# Patient Record
Sex: Female | Born: 1945 | Race: Black or African American | Hispanic: No | Marital: Single | Smoking: Never smoker
Health system: Southern US, Community
[De-identification: ages and names within clinical notes are randomized; demographics above are authoritative.]

## PROBLEM LIST (undated history)

## (undated) DIAGNOSIS — D649 Anemia, unspecified: Secondary | ICD-10-CM

## (undated) DIAGNOSIS — E559 Vitamin D deficiency, unspecified: Secondary | ICD-10-CM

## (undated) HISTORY — PX: TUBAL LIGATION: SHX77

## (undated) HISTORY — DX: Vitamin D deficiency, unspecified: E55.9

## (undated) HISTORY — PX: OTHER SURGICAL HISTORY: SHX169

## (undated) HISTORY — DX: Anemia, unspecified: D64.9

---

## 1997-09-28 ENCOUNTER — Ambulatory Visit (HOSPITAL_COMMUNITY): Admission: RE | Admit: 1997-09-28 | Discharge: 1997-09-28 | Payer: Self-pay | Admitting: Gastroenterology

## 1997-11-15 ENCOUNTER — Other Ambulatory Visit: Admission: RE | Admit: 1997-11-15 | Discharge: 1997-11-15 | Payer: Self-pay | Admitting: Obstetrics and Gynecology

## 1998-06-23 ENCOUNTER — Other Ambulatory Visit: Admission: RE | Admit: 1998-06-23 | Discharge: 1998-06-23 | Payer: Self-pay | Admitting: Obstetrics and Gynecology

## 1998-09-05 ENCOUNTER — Encounter: Payer: Self-pay | Admitting: Family Medicine

## 1998-09-05 ENCOUNTER — Ambulatory Visit (HOSPITAL_COMMUNITY): Admission: RE | Admit: 1998-09-05 | Discharge: 1998-09-05 | Payer: Self-pay | Admitting: Family Medicine

## 1998-09-22 ENCOUNTER — Encounter: Payer: Self-pay | Admitting: Gastroenterology

## 1998-09-22 ENCOUNTER — Ambulatory Visit (HOSPITAL_COMMUNITY): Admission: RE | Admit: 1998-09-22 | Discharge: 1998-09-22 | Payer: Self-pay | Admitting: Gastroenterology

## 1998-11-01 ENCOUNTER — Ambulatory Visit (HOSPITAL_COMMUNITY): Admission: RE | Admit: 1998-11-01 | Discharge: 1998-11-01 | Payer: Self-pay | Admitting: Gastroenterology

## 1998-11-01 ENCOUNTER — Encounter: Payer: Self-pay | Admitting: Gastroenterology

## 1998-11-10 ENCOUNTER — Encounter: Payer: Self-pay | Admitting: Gastroenterology

## 1998-11-10 ENCOUNTER — Ambulatory Visit (HOSPITAL_COMMUNITY): Admission: RE | Admit: 1998-11-10 | Discharge: 1998-11-10 | Payer: Self-pay | Admitting: Gastroenterology

## 1998-12-21 ENCOUNTER — Encounter: Admission: RE | Admit: 1998-12-21 | Discharge: 1998-12-21 | Payer: Self-pay | Admitting: Family Medicine

## 1999-01-02 ENCOUNTER — Other Ambulatory Visit: Admission: RE | Admit: 1999-01-02 | Discharge: 1999-01-02 | Payer: Self-pay | Admitting: Obstetrics and Gynecology

## 1999-11-13 ENCOUNTER — Encounter (INDEPENDENT_AMBULATORY_CARE_PROVIDER_SITE_OTHER): Payer: Self-pay | Admitting: *Deleted

## 1999-11-13 ENCOUNTER — Ambulatory Visit (HOSPITAL_COMMUNITY): Admission: RE | Admit: 1999-11-13 | Discharge: 1999-11-13 | Payer: Self-pay | Admitting: Gastroenterology

## 1999-11-20 ENCOUNTER — Encounter: Admission: RE | Admit: 1999-11-20 | Discharge: 1999-11-20 | Payer: Self-pay | Admitting: Gastroenterology

## 1999-11-20 ENCOUNTER — Encounter: Payer: Self-pay | Admitting: Gastroenterology

## 1999-11-30 ENCOUNTER — Encounter: Payer: Self-pay | Admitting: General Surgery

## 1999-12-03 ENCOUNTER — Inpatient Hospital Stay (HOSPITAL_COMMUNITY): Admission: RE | Admit: 1999-12-03 | Discharge: 1999-12-10 | Payer: Self-pay | Admitting: General Surgery

## 1999-12-03 ENCOUNTER — Encounter (INDEPENDENT_AMBULATORY_CARE_PROVIDER_SITE_OTHER): Payer: Self-pay | Admitting: Specialist

## 2000-03-20 ENCOUNTER — Other Ambulatory Visit: Admission: RE | Admit: 2000-03-20 | Discharge: 2000-03-20 | Payer: Self-pay | Admitting: Obstetrics and Gynecology

## 2000-06-27 ENCOUNTER — Inpatient Hospital Stay (HOSPITAL_COMMUNITY): Admission: RE | Admit: 2000-06-27 | Discharge: 2000-06-29 | Payer: Self-pay | Admitting: General Surgery

## 2000-09-03 ENCOUNTER — Encounter: Admission: RE | Admit: 2000-09-03 | Discharge: 2000-09-03 | Payer: Self-pay | Admitting: Obstetrics and Gynecology

## 2000-09-03 ENCOUNTER — Encounter: Payer: Self-pay | Admitting: Obstetrics and Gynecology

## 2001-02-03 ENCOUNTER — Inpatient Hospital Stay (HOSPITAL_COMMUNITY): Admission: AD | Admit: 2001-02-03 | Discharge: 2001-02-04 | Payer: Self-pay | Admitting: Family Medicine

## 2001-03-27 ENCOUNTER — Other Ambulatory Visit: Admission: RE | Admit: 2001-03-27 | Discharge: 2001-03-27 | Payer: Self-pay | Admitting: Obstetrics and Gynecology

## 2001-04-17 ENCOUNTER — Encounter (HOSPITAL_COMMUNITY): Admission: RE | Admit: 2001-04-17 | Discharge: 2001-07-16 | Payer: Self-pay | Admitting: Gastroenterology

## 2001-05-20 ENCOUNTER — Encounter (INDEPENDENT_AMBULATORY_CARE_PROVIDER_SITE_OTHER): Payer: Self-pay | Admitting: Specialist

## 2001-05-20 ENCOUNTER — Ambulatory Visit (HOSPITAL_COMMUNITY): Admission: RE | Admit: 2001-05-20 | Discharge: 2001-05-20 | Payer: Self-pay | Admitting: Gastroenterology

## 2001-05-25 ENCOUNTER — Encounter: Payer: Self-pay | Admitting: Gastroenterology

## 2001-05-25 ENCOUNTER — Encounter: Admission: RE | Admit: 2001-05-25 | Discharge: 2001-05-25 | Payer: Self-pay | Admitting: Gastroenterology

## 2001-09-04 ENCOUNTER — Encounter: Payer: Self-pay | Admitting: Family Medicine

## 2001-09-04 ENCOUNTER — Encounter: Admission: RE | Admit: 2001-09-04 | Discharge: 2001-09-04 | Payer: Self-pay | Admitting: Family Medicine

## 2002-04-21 ENCOUNTER — Encounter: Payer: Self-pay | Admitting: Family Medicine

## 2002-04-21 ENCOUNTER — Encounter: Admission: RE | Admit: 2002-04-21 | Discharge: 2002-04-21 | Payer: Self-pay | Admitting: Family Medicine

## 2002-08-12 ENCOUNTER — Encounter: Admission: RE | Admit: 2002-08-12 | Discharge: 2002-08-12 | Payer: Self-pay | Admitting: Family Medicine

## 2002-08-12 ENCOUNTER — Encounter: Payer: Self-pay | Admitting: Family Medicine

## 2002-09-07 ENCOUNTER — Encounter: Admission: RE | Admit: 2002-09-07 | Discharge: 2002-09-07 | Payer: Self-pay | Admitting: Obstetrics and Gynecology

## 2002-09-07 ENCOUNTER — Encounter: Payer: Self-pay | Admitting: Obstetrics and Gynecology

## 2003-09-16 ENCOUNTER — Encounter: Admission: RE | Admit: 2003-09-16 | Discharge: 2003-09-16 | Payer: Self-pay | Admitting: Family Medicine

## 2004-09-26 ENCOUNTER — Encounter: Admission: RE | Admit: 2004-09-26 | Discharge: 2004-09-26 | Payer: Self-pay | Admitting: Family Medicine

## 2005-09-27 ENCOUNTER — Encounter: Admission: RE | Admit: 2005-09-27 | Discharge: 2005-09-27 | Payer: Self-pay | Admitting: Family Medicine

## 2006-06-24 ENCOUNTER — Ambulatory Visit: Payer: Self-pay | Admitting: *Deleted

## 2006-08-26 ENCOUNTER — Ambulatory Visit: Payer: Self-pay | Admitting: Vascular Surgery

## 2006-09-29 ENCOUNTER — Encounter: Admission: RE | Admit: 2006-09-29 | Discharge: 2006-09-29 | Payer: Self-pay | Admitting: Family Medicine

## 2007-05-07 ENCOUNTER — Encounter: Admission: RE | Admit: 2007-05-07 | Discharge: 2007-05-07 | Payer: Self-pay | Admitting: *Deleted

## 2007-09-30 ENCOUNTER — Encounter: Admission: RE | Admit: 2007-09-30 | Discharge: 2007-09-30 | Payer: Self-pay | Admitting: Family Medicine

## 2007-10-04 ENCOUNTER — Emergency Department (HOSPITAL_COMMUNITY): Admission: EM | Admit: 2007-10-04 | Discharge: 2007-10-04 | Payer: Self-pay | Admitting: Emergency Medicine

## 2008-09-30 ENCOUNTER — Encounter: Admission: RE | Admit: 2008-09-30 | Discharge: 2008-09-30 | Payer: Self-pay | Admitting: Family Medicine

## 2009-04-14 ENCOUNTER — Ambulatory Visit: Payer: Self-pay | Admitting: Vascular Surgery

## 2009-10-04 ENCOUNTER — Encounter: Admission: RE | Admit: 2009-10-04 | Discharge: 2009-10-04 | Payer: Self-pay | Admitting: Family Medicine

## 2010-07-27 NOTE — Procedures (Signed)
[place]. [facility]  Patient:    Maria Hampton, Maria Hampton Visit Number: [id number] MRN: [id number]          Service Type: END Location: ENDO Attending Physician:  Charna Elizabeth Dictated by:   Anselmo Rod, M.D. Proc. Date: 05/20/01 Admit Date:  05/20/2001   CC:         Geraldo Pitter, M.D.  Adolph Pollack, M.D.   Procedure Report  DATE OF BIRTH:  November 26, 1945  REFERRING PHYSICIAN:  Geraldo Pitter, M.D.  PROCEDURE PERFORMED:  Esophagogastroduodenoscopy.  ENDOSCOPIST:  Anselmo Rod, M.D.  INSTRUMENT USED:  Olympus video panendoscope.  INDICATIONS FOR PROCEDURE:  Iron deficiency anemia in a 65 year old African-American female.  Rule out peptic ulcer disease, esophagitis, gastritis, etc.  PREPROCEDURE PREPARATION:  Informed consent was procured from the patient. The patient was fasted for eight hours prior to the procedure.  PREPROCEDURE PHYSICAL:  The patient had stable vital signs.  Neck supple. Chest clear to auscultation.  S1, S2 regular.  Abdomen obese with a well-healed surgical scar.  Nontender with normal bowel sounds.  DESCRIPTION OF PROCEDURE:  The patient was placed in left lateral decubitus position and sedated with 70 mg of Demerol and 6 mg of Versed intravenously. Once the patient was adequately sedated and maintained on low-flow oxygen and continuous cardiac monitoring, the Olympus video panendoscope was advanced through the mouthpiece, over the tongue, into the esophagus under direct vision.  The entire esophagus, stomach, and proximal small bowel appeared normal and without lesions.  IMPRESSION:  Normal esophagogastroduodenoscopy.  RECOMMENDATION:  Proceed with colonoscopy at this time. Dictated by:   Anselmo Rod, M.D. Attending Physician:  Charna Elizabeth DD:  05/20/01 TD:  05/21/01 Job: [id number]

## 2010-07-27 NOTE — H&P (Signed)
[facility]  Patient:    Maria Hampton, Maria Hampton                 MRN: [id number] Adm. Date:  [id number] Disc. Date: [id number] Attending:  Charna Elizabeth                         History and Physical  REASON FOR ADMISSION:  Elective intestinal resection.  HISTORY OF PRESENT ILLNESS:  Maria Hampton is a 65 year old female with a long history of Crohns disease.  She initially had a partial small bowel resection and partial ascending colon resection back in 1985 by Dr. Milus Mallick. By report, 18 inches of intestine were removed at this time.  Approximately one year prior to admission, she began having some crampy abdominal pain, was treated medically and this improved.  She had been having similar problems; however, medical therapy has failed her.  Dr. Anselmo Rod ordered an upper GI and small-bowel follow-through, which shows approximately 30- to 40-cm area of narrowing in the terminal ileum area; there are also areas suggesting fistulous tracts in that region.  A colonoscopy was performed which demonstrated some active inflammation in the proximal transverse colon.  She is admitted for elective resection because she has partial intestinal obstruction symptoms and this diseased segment is not responding to medical management.  PAST MEDICAL HISTORY:  Crohns disease.  PREVIOUS OPERATIONS:  Laparotomy, resection of distal ileum and part of ascending colon in 1985.  ALLERGIES:  SULFA MEDICATIONS, ASACOL, IRON, CLIMARA, MEDROXYPROGESTERONE, CELEBREX, DICYCLOMINE, FOLIC ACID, VITAMIN B12 INJECTIONS, ZYRTEC.  SOCIAL HISTORY:  She is divorced.  She denies use of tobacco or alcohol.  She works for [employer].  REVIEW OF SYSTEMS:  CARDIOVASCULAR:  No significant hypertension or coronary artery disease.  PULMONARY:  No acute or chronic lung problems.  HEMATOLOGIC: No previous deep venous thrombosis or bleeding disorders.  PHYSICAL  EXAMINATION  GENERAL:  A well-developed, well-nourished female in no acute distress, very pleasant and cooperative.  EYES:  Extraocular muscles intact.  Sclerae clear.  NECK:  Supple without palpable masses.  NODES:  No palpable cervical or supraclavicular adenopathy.  CARDIOVASCULAR:  Heart demonstrates a regular rate and rhythm without a murmur.  No lower extremity edema.  RESPIRATORY:  Breath sounds equal and clear.  Respirations nonlabored.  ABDOMEN:  Soft, nontender.  There is a well-healed midline scar.  No evidence of ______ scar.  No palpable masses are noted.  No organomegaly is noted.  MUSCULOSKELETAL:  Full range of motion.  No cyanosis or edema.  IMPRESSION:  Crohns disease with evidence of partial intestinal obstruction and a long stricture.  She has progressive symptoms despite medical therapy.  PLAN:  Exploratory laparotomy and bowel resection.  The procedure and risks include, but are not limited to, bleeding, infection, anastomotic problems, possibility of malabsorption syndrome and the risks of anesthesia were explained to her.  She seemed to understand and agreed to proceed. DD:  12/03/99 TD:  12/03/99 Job: [id number]

## 2010-07-27 NOTE — Op Note (Signed)
[facility]  Patient:    Maria Hampton, Maria Hampton Visit Number: [id number] MRN: [id number]          Service Type: MED Location: [phone] Attending Physician:  Beverely Low Proc. Date: 12/03/99 Admit Date:  02/03/2001 Discharge Date: 02/04/2001                             Operative Report  PREOPERATIVE DIAGNOSES:  Partial small bowel obstruction secondary to Crohns disease.  POSTOPERATIVE DIAGNOSES:  Partial small bowel obstruction secondary to Crohns disease.  PROCEDURE:  Exploratory laparotomy, partial colectomy with resection of 10 cm of distal ileum.  SURGEON:  Dr. Abbey Chatters.  ASSISTANT:  Dr. Francina Ames.  ANESTHESIA:  General.  INDICATIONS FOR PROCEDURE:  Maria Hampton is a 65 year old female with known Crohns disease who underwent a right colectomy and distal ileum resection approximately 15-16 years ago for complications from Crohns disease. Recently, she has been having progressively increasing crampy abdominal pain and occult blood loss with diarrhea. She is anemic with a hemoglobin of 8.5. She underwent a small bowel follow through which demonstrated a stricture which would be near the site of the anastomosis. She also underwent endoscopy which demonstrated disease segments of what appeared to be transverse colon. She is admitted today for exploratory operation.  FINDINGS:  At the area of anastomosis between the ileum and what appeared to be the hepatic flexure of the right colon, there was a firm mass that was partially obstructed. The proximal 1/3 to 1/2 of the transverse colon was involved with the diseased segment. Except for a small portion of the distal ileum, the rest of the small intestinal appeared normal.  TECHNIQUE:  She was placed supine on the operating table and a general anesthetic was administered. Her abdomen was sterilely prepped and draped. A previous midline incision was reincised all the way down to the  level of the fascia. Old sutures were removed. The fascia was incised and the underlying peritoneum was then incised sharply entering the peritoneal cavity. There were some adhesions from the omentum to the anterior abdominal wall superiorly on the incision but no significant intestinal adhesions. Once inside the abdomen, it was explored.  I ______ the small intestine, ran it for its length, and it appeared to be normal except at that point where there was anastomosis between the small intestine and the colon. This appeared to be in the area of the proximal ascending colon/hepatic flexure region. There was a firm inflammatory mass partially obstructing. The proximal 1/3 to 1/2 of the transverse colon was also diseased and thickened.  I subsequently mobilized the previous anastomotic site away from the kidney and the duodenum using cautery and sharp dissection. I divided the omentum at the point where the transverse colon became normal. I then divided the distal ileum resecting approximately 10 cm of it and then divided the transverse colon. I ligated the mesenteric vessels and handed the specimen off the field. Next, a side to side stapled anastomosis was performed between the mid transverse colon and the ileum. The anastomosis was patent, viable and under no tension. The mesenteric defect was closed with interrupted Vicryl sutures.  I subsequently irrigated out the abdominal cavity and no bleeding was noted. I then placed a piece of ______ film between the viscera and the anterior abdominal wall and closed the fascia with running #1 PDS suture. The subcutaneous tissue was irrigated and skin was closed with staples.  The patient tolerated the procedure well without any apparent complications and was taken to the recovery room in satisfactory condition. Attending Physician:  Beverely Low DD:  12/03/99 TD:  12/04/99 Job: [id number]

## 2010-07-27 NOTE — Op Note (Signed)
[facility]  Patient:    Maria Hampton, Maria Hampton                   MRN: [id number] Proc. Date: 06/27/00 Adm. Date:  [id number] Disc. Date: [id number] Attending:  Arlis Porta CC:         Anselmo Rod, M.D.  Geraldo Pitter, M.D.   Operative Report  PREOPERATIVE DIAGNOSIS:  Ventral incisional hernia.  POSTOPERATIVE DIAGNOSIS:  Ventral incisional hernia.  PROCEDURE:  Laparoscopic repair of ventral incisional hernia, with Gore-Tex dual-duty radial mesh.  SURGEON:  Adolph Pollack, M.D.  ASSISTANTGunnar Fusi ______, M.D.  ANESTHESIA:  General.  INDICATIONS:  This is a 65 year old female who underwent reoperation for recurrent Crohns disease, which is complicated by wound infection. Subsequently developed an incisional hernia, which has become symptomatic, and now she presents for repair.  TECHNIQUE:  She is placed supine on the operating table and a general anesthetic was administered.  The hernia was palpated and marked.  The abdomen was sterilely prepped and draped.  A left upper quadrant lateral incision was made into the skin and subcutaneous tissue dissected.  The anterior layer of the fascia was incised.  Using blunt dissection the abdominal cavity was entered and an Hasson trocar introduced into the peritoneal cavity.  A pneumoperitoneum was created by insufflation of CO2 gas.  Next the laparoscope was introduced and I could identify the hernia.  There were also adhesions from the small intestine to the anterior abdominal wall.  Under direct vision, a 5 mm trocar was placed in the left lower quadrant and the adhesions to the anterior abdominal wall and intestine were divided sharply, dropping the intestine down into the peritoneal cavity.  An 11 mm trocar was then placed in the right mid abdomen.  I marked the periphery of the hernia with spinal needles, and then measured 3 cm away from each of these and drew an oval. This would  accommodate a 15 x 19 cm Gore-Tex dual mesh, which was brought into the field.  Anchoring sutures were placed at four quadrants, and the four quadrants were marked and corresponding marks made on the skin.  Small incisions were made at all the four quadrants.  The mesh was then inserted into the abdominal cavity, after the anchoring sutures of the four quadrants had been placed.  Once the mesh was in the abdominal cavity, it was unfurled and then through each of the small incisions the anchoring sutures were brought up through the anterior abdominal wall, then tied down and anchoring the mesh to the anterior abdominal wall.  Further anchoring was done by using the spiral tacker around the periphery.  I added one extra 5 mm trocar to the right lower quadrant in order to facilitate this.  I then placed an inner rim of tacks.  This more than adequately anchored the mesh up to the anterior abdominal wall and more then adequately covered the hernia defect.  Next, I released the pneumoperitoneum and all the trocars were removed.  The left upper quadrant fascial defect was closed with interrupted Vicryl suture. The skin incisions were closed with 4-0 Monocryl subcuticular stitches, and followed by Steri-Strips and sterile dressings.  She tolerated the procedure well without any apparent complications.  She was taken to the operating room in satisfactory condition. DD:  06/27/00 TD:  06/30/00 Job: [id number]

## 2010-07-27 NOTE — Procedures (Signed)
[place]. [facility]  Patient:    Maria Hampton, Maria Hampton Visit Number: [id number] MRN: [id number]          Service Type: END Location: ENDO Attending Physician:  Charna Elizabeth Dictated by:   Anselmo Rod, M.D. Proc. Date: 05/20/01 Admit Date:  05/20/2001   CC:         Geraldo Pitter, M.D.  Adolph Pollack, M.D.   Procedure Report  DATE OF BIRTH:  29-Dec-1945  REFERRING PHYSICIAN:  Geraldo Pitter, M.D.  PROCEDURE PERFORMED:  Colonoscopy with biopsies.  ENDOSCOPIST:  Anselmo Rod, M.D.  INSTRUMENT USED:  Olympus pediatric colonoscope.  INDICATIONS FOR PROCEDURE:  Severe iron deficiency anemia in a 65 year old African-American female, rule out recurrent Crohns.  The patient has had a distal ileal resection and a right colectomy in September of 2001 for Crohns disease causing small bowel obstruction.  PREPROCEDURE PREPARATION:  Informed consent was procured from the patient. The patient was fasted for eight hours prior to the procedure and prepped with a bottle of magnesium citrate and a gallon of NuLytely the night prior to the procedure.  PREPROCEDURE PHYSICAL:  The patient had stable vital signs.  Neck supple. Chest clear to auscultation.  S1, S2 regular.  Abdomen soft with normal abdominal bowel sounds.  A well-healed surgical scar is present.  DESCRIPTION OF PROCEDURE:  The patient was placed in the left lateral decubitus position and sedated for her EGD and no additional sedation was used for colonoscopy.  Once the patient was adequately positioned and  maintained on low-flow oxygen and continuous cardiac monitoring, the Olympus video colonoscope was advanced from the rectum to the anastomosis at about 100 cm without difficulty.  There was a patch of ulceration seen at the anastomosis which was biopsied for pathology.  Another patchy area of ulceration was seen at 20 cm.  A small patch of ulceration was noted at the anal verge.   All these areas seemed to represent recurrent Crohns.  Multiple biopsies were done.  The rest of the colonic mucosa between these areas of ulceration appeared healthy and without lesion.  IMPRESSION:  Patchy ulcerations at the anal verge at 20 cm and at the anastomosis.  The rest of the colon appeared healthy.  RECOMMENDATIONS: 1. Await pathology results.  Check CBC today along with ESR. 2. Small bowel follow-through to be scheduled ASAP. 3. Outpatient follow-up within the next week.Dictated by:   Anselmo Rod, M.D. Attending Physician:  Charna Elizabeth DD:  05/20/01 TD:  05/21/01 Job: [id number]

## 2010-07-27 NOTE — Discharge Summary (Signed)
[facility]  Patient:    Maria Hampton, Maria Hampton                   MRN: [id number] Adm. Date:  [id number] Disc. Date: [id number] Attending:  Arlis Porta CC:         Anselmo Rod, M.D.  Geraldo Pitter, M.D.   Discharge Summary  PRINCIPAL DISCHARGE DIAGNOSIS:  Partial intestinal obstruction secondary to Crohns disease.  SECONDARY DIAGNOSES: 1. Chronic anemia. 2. Mild postoperative ileus.  PROCEDURE:  Exploratory laparotomy, partial cholecystectomy with resection of distal ileum, December 03, 1999.  REASON FOR ADMISSION:  Ms. Seevers is a 65 year old female with a long history of Crohns disease who had a small bowel resection and partial right colectomy back in 1985.  She began having progressively increasing, crampy right lower quadrant pain and evaluation demonstrated a long stricture around the area of the anastomosis.  She did not respond to medical therapy and, subsequently, she was admitted for elective surgery.  HOSPITAL COURSE:  Postoperatively, she was on a PCA pump and adequate pain control.  She came in anemic and was somewhat anemic after the operation and was started on Procrit.  She did not receive a blood transfusion. She had some mild hyponatremia and a mild postoperative ileus that began to resolve.  She was started on a diet and switched over to oral analgesics.  Incisions healed well and she had, otherwise, an unremarkable postoperative course.  Her hemoglobin on December 10, 1999, was 7.1 and she was hemodynamically stable and felt to be ready for discharge.  DISPOSITION:  Discharged to home on December 10, 1999.  She is to continue all of her home medications including her Asacol and her iron.  I gave her Vicodin for pain.  She was given a sheet with activity restrictions and diet suggestions.  She will come back and see me in the office in two weeks and was told to make an appointment to see Dr. Loreta Ave in four weeks.  I  believe she is going to need some suppressive therapy for her Crohns disease. DD:  12/26/99 TD:  12/26/99 Job: [id number]

## 2010-07-27 NOTE — Procedures (Signed)
[place]. [facility]  Patient:    Maria Hampton, Maria Hampton                 MRN: [id number] Proc. Date: 11/13/99 Adm. Date:  [id number] Attending:  Charna Elizabeth CC:         Geraldo Pitter, M.D.                           Procedure Report  DATE OF BIRTH:  [birth year].  REFERRING PHYSICIAN:  Geraldo Pitter, M.D.  PROCEDURE PERFORMED:  Colonoscopy with biopsies.  ENDOSCOPIST:  Anselmo Rod, M.D.  INSTRUMENT USED:  Olympus video colonoscope.  INDICATIONS FOR PROCEDURE:  Recurrent abdominal pain with guaiac positive stools and anemia in a 65 year old black female with a history of Crohns status post resection of the terminal ileum in the past.  Colonoscopy is being done to evaluate extent of disease.  PREPROCEDURE PREPARATION:  Informed consent was procured from the patient. The patient was fasted for eight hours prior to the procedure and prepped with Fleets Phospho-Soda the night prior to the procedure.  PREPROCEDURE PHYSICAL:  The patient had stable vital signs.  Neck supple. Chest clear to auscultation.  S1, S2 regular.  No murmur, rub or gallop.  No rales, rhonchi or wheezing.  Abdomen soft with normal abdominal bowel sounds.  DESCRIPTION OF PROCEDURE:  The patient was placed in the left lateral decubitus position and sedated with 50 mg of Demerol and 5 mg of Versed intravenously.  Once the patient was adequately sedated and maintained on low-flow oxygen and continuous cardiac monitoring, the Olympus video colonoscope was advanced from the rectum to the anastomosis without difficulty.  The entire colonic mucosa up to the anastomosis appeared healthy except for a small patch of inflammation and ulceration at 30 cm.  At the anastomosis, significant stricturing was noted and the scope could not be advanced into the small bowel.  Biopsies were done from around the anastomosis.  There was significant ulceration and friability around the anastomosis.  No  other abnormalities were noted.  The rest of the colonic mucosa had normal vascular pattern with no evidence of ulceration.  The patient tolerated the procedure well without complication.  IMPRESSION:  Patchy disease at the anastomosis and at 30 cm.  Biopsies done. Results pending.  Otherwise normal-appearing colon.  RECOMMENDATIONS: 1. Await pathology results. 2. Avoid all nonsteroidals. 3. Small bowel follow-through to evaluate the anastomosis. 4. Outpatient follow-up in the next two weeks.DD:  11/13/99 TD:  11/13/99 Job: [id number]

## 2010-09-10 ENCOUNTER — Other Ambulatory Visit: Payer: Self-pay | Admitting: Family Medicine

## 2010-09-10 DIAGNOSIS — Z1231 Encounter for screening mammogram for malignant neoplasm of breast: Secondary | ICD-10-CM

## 2010-10-08 ENCOUNTER — Ambulatory Visit
Admission: RE | Admit: 2010-10-08 | Discharge: 2010-10-08 | Disposition: A | Discharge status: Home / Self Care | Payer: BC Managed Care – PPO | Source: Ambulatory Visit | Attending: Family Medicine | Admitting: Family Medicine

## 2010-10-08 DIAGNOSIS — Z1231 Encounter for screening mammogram for malignant neoplasm of breast: Secondary | ICD-10-CM

## 2011-06-14 ENCOUNTER — Other Ambulatory Visit: Payer: Self-pay | Admitting: Orthopaedic Surgery

## 2011-06-14 DIAGNOSIS — M25511 Pain in right shoulder: Secondary | ICD-10-CM

## 2011-06-21 ENCOUNTER — Ambulatory Visit
Admission: RE | Admit: 2011-06-21 | Discharge: 2011-06-21 | Disposition: A | Discharge status: Home / Self Care | Payer: BC Managed Care – PPO | Source: Ambulatory Visit | Attending: Orthopaedic Surgery | Admitting: Orthopaedic Surgery

## 2011-06-21 DIAGNOSIS — M25511 Pain in right shoulder: Secondary | ICD-10-CM

## 2011-06-21 MED ORDER — IOHEXOL 180 MG/ML  SOLN
10.0000 mL | Freq: Once | INTRAMUSCULAR | Status: AC | PRN
  Administered 2011-06-21: 10 mL via INTRA_ARTICULAR

## 2011-11-27 ENCOUNTER — Other Ambulatory Visit: Payer: Self-pay | Admitting: Family Medicine

## 2011-11-27 DIAGNOSIS — Z1231 Encounter for screening mammogram for malignant neoplasm of breast: Secondary | ICD-10-CM

## 2011-12-18 ENCOUNTER — Ambulatory Visit
Admission: RE | Admit: 2011-12-18 | Discharge: 2011-12-18 | Disposition: A | Discharge status: Home / Self Care | Payer: BC Managed Care – PPO | Source: Ambulatory Visit | Attending: Family Medicine | Admitting: Family Medicine

## 2011-12-18 DIAGNOSIS — Z1231 Encounter for screening mammogram for malignant neoplasm of breast: Secondary | ICD-10-CM

## 2013-01-06 ENCOUNTER — Other Ambulatory Visit: Payer: Self-pay

## 2013-01-06 DIAGNOSIS — Z1231 Encounter for screening mammogram for malignant neoplasm of breast: Secondary | ICD-10-CM

## 2013-02-09 ENCOUNTER — Ambulatory Visit
Admission: RE | Admit: 2013-02-09 | Discharge: 2013-02-09 | Disposition: A | Discharge status: Home / Self Care | Payer: Medicare Other | Source: Ambulatory Visit

## 2013-02-09 DIAGNOSIS — Z1231 Encounter for screening mammogram for malignant neoplasm of breast: Secondary | ICD-10-CM

## 2013-02-10 ENCOUNTER — Encounter: Payer: Self-pay | Admitting: Obstetrics & Gynecology

## 2013-02-10 ENCOUNTER — Ambulatory Visit (INDEPENDENT_AMBULATORY_CARE_PROVIDER_SITE_OTHER): Payer: 59 | Admitting: Obstetrics & Gynecology

## 2013-02-10 VITALS — BP 158/102 | HR 90 | Temp 98.4°F | Ht 66.0 in | Wt 229.4 lb

## 2013-02-10 DIAGNOSIS — B372 Candidiasis of skin and nail: Secondary | ICD-10-CM

## 2013-02-10 DIAGNOSIS — Z01419 Encounter for gynecological examination (general) (routine) without abnormal findings: Secondary | ICD-10-CM

## 2013-02-10 MED ORDER — CLOTRIMAZOLE 1 % EX CREA: 1.0000 "application " | TOPICAL_CREAM | Freq: Two times a day (BID) | CUTANEOUS | Status: DC

## 2013-02-10 NOTE — Progress Notes (Signed)
  Subjective:    Maria Hampton is a 67 y.o. female who presents for an annual exam. The patient has no complaints today. The patient is sexually active. GYN screening history: last pap: approximate date 2010 and was normal. The patient wears seatbelts: yes. The patient participates in regular exercise: no. Has the patient ever been transfused or tattooed?: yes. The patient reports that there is not domestic violence in her life.   Menstrual History: OB History   Grav Para Term Preterm Abortions TAB SAB Ect Mult Living                  No LMP recorded. Patient is postmenopausal.    The following portions of the patient's history were reviewed and updated as appropriate: allergies, current medications, past family history, past medical history, past social history, past surgical history and problem list.  Review of Systems Pertinent items are noted in HPI.    Objective:      General appearance: alert Breasts: normal appearance, no masses or tenderness Abdomen: soft, non-tender; bowel sounds normal; no masses,  no organomegaly; erythematous plaque--intertriginous skin right LE Pelvic: cervix normal in appearance, perineum with draining tracts, sinuses, no adnexal masses or tenderness, uterus normal size, shape, and consistency and vagina normal without discharge    Assessment:    Crohn's disease with perineal fistulae ?Skin changes related to the Crohn's  disease  Plan:     Topical antifungal--may need oral agent Referral-->Dermatology Return prn or 1 yr

## 2013-02-12 LAB — PAP IG AND HPV HIGH-RISK

## 2013-02-12 NOTE — Patient Instructions (Signed)

## 2013-03-18 ENCOUNTER — Encounter: Payer: Self-pay | Admitting: Obstetrics & Gynecology

## 2013-08-11 ENCOUNTER — Other Ambulatory Visit: Payer: Self-pay | Admitting: Family Medicine

## 2013-08-11 ENCOUNTER — Ambulatory Visit
Admission: RE | Admit: 2013-08-11 | Discharge: 2013-08-11 | Disposition: A | Discharge status: Home / Self Care | Payer: 59 | Source: Ambulatory Visit | Attending: Family Medicine | Admitting: Family Medicine

## 2013-08-11 DIAGNOSIS — M549 Dorsalgia, unspecified: Secondary | ICD-10-CM

## 2013-11-14 ENCOUNTER — Encounter (HOSPITAL_COMMUNITY): Payer: Self-pay | Admitting: Emergency Medicine

## 2013-11-14 ENCOUNTER — Emergency Department (HOSPITAL_COMMUNITY)
Admission: EM | Admit: 2013-11-14 | Discharge: 2013-11-14 | Disposition: A | Discharge status: Home / Self Care | Payer: Medicare Other | Attending: Emergency Medicine | Admitting: Emergency Medicine

## 2013-11-14 DIAGNOSIS — IMO0002 Reserved for concepts with insufficient information to code with codable children: Secondary | ICD-10-CM | POA: Diagnosis not present

## 2013-11-14 DIAGNOSIS — IMO0001 Reserved for inherently not codable concepts without codable children: Secondary | ICD-10-CM | POA: Insufficient documentation

## 2013-11-14 DIAGNOSIS — D649 Anemia, unspecified: Secondary | ICD-10-CM | POA: Diagnosis not present

## 2013-11-14 DIAGNOSIS — E559 Vitamin D deficiency, unspecified: Secondary | ICD-10-CM | POA: Insufficient documentation

## 2013-11-14 DIAGNOSIS — Z79899 Other long term (current) drug therapy: Secondary | ICD-10-CM | POA: Insufficient documentation

## 2013-11-14 DIAGNOSIS — M791 Myalgia, unspecified site: Secondary | ICD-10-CM

## 2013-11-14 DIAGNOSIS — R52 Pain, unspecified: Secondary | ICD-10-CM | POA: Insufficient documentation

## 2013-11-14 LAB — I-STAT CHEM 8, ED
BUN: 12 mg/dL (ref 6–23)
CHLORIDE: 108 meq/L (ref 96–112)
CREATININE: 1 mg/dL (ref 0.50–1.10)
Calcium, Ion: 1.13 mmol/L (ref 1.13–1.30)
Glucose, Bld: 86 mg/dL (ref 70–99)
HCT: 39 % (ref 36.0–46.0)
HEMOGLOBIN: 13.3 g/dL (ref 12.0–15.0)
POTASSIUM: 3.8 meq/L (ref 3.7–5.3)
Sodium: 136 mEq/L — ABNORMAL LOW (ref 137–147)
TCO2: 35 mmol/L (ref 0–100)

## 2013-11-14 LAB — CK: CK TOTAL: 60 U/L (ref 7–177)

## 2013-11-14 MED ORDER — NAPROXEN 500 MG PO TABS: 500.0000 mg | ORAL_TABLET | Freq: Two times a day (BID) | ORAL | Status: DC

## 2013-11-14 MED ORDER — METHOCARBAMOL 500 MG PO TABS: 500.0000 mg | ORAL_TABLET | Freq: Two times a day (BID) | ORAL | Status: DC | PRN

## 2013-11-14 NOTE — ED Provider Notes (Signed)
Medical screening examination/treatment/procedure(s) were performed by non-physician practitioner and as supervising physician I was immediately available for consultation/collaboration.   EKG Interpretation None        Kizzy Olafson, MD 11/14/13 1607 

## 2013-11-14 NOTE — ED Notes (Signed)
Patient states she had diarrhea a week ago and no longer has now. Patient states she has body aches, just not feeling well since then.

## 2013-11-14 NOTE — ED Notes (Signed)
Pt thinks she has a virus.  C/o fatigue, body aches, diarrhea, nausea (no vomiting), headache and cough x 1 wk.

## 2013-11-14 NOTE — ED Provider Notes (Signed)
CSN: [id number]     Arrival date & time 11/14/13  0840 History   First MD Initiated Contact with Patient 11/14/13 0913     Chief Complaint  Patient presents with  . Generalized Body Aches  . Diarrhea     (Consider location/radiation/quality/duration/timing/severity/associated sxs/prior Treatment) HPI Maria Hampton is a 68 y.o. female with a history of IBS who comes in for evaluation of generally not feeling well. She states it all started a week ago Sunday when she had generalized muscle aches and headache and a little diarrhea. She states "by Tuesday everything had resolved and I felt well until Saturday". Yesterday on Saturday she says the muscle aches return to which she localized to her hips and to a lesser extent the rest of her body. She says she just feels tired and doesn't have much energy. She has taken Tylenol without relief. No new medications, new exercise patterns, or injuries/trauma. She denies fevers at home, but does say she felt warm last night. She denies any current headache, abd pain, nausea vomiting, difficulties breathing, chest pain, new rashes, numbness or weakness.  Past Medical History  Diagnosis Date  . Vitamin D deficiency   . Anemia    Past Surgical History  Procedure Laterality Date  . Bowel reconnection      x2  . Tubal ligation     Family History  Problem Relation Age of Onset  . Hypertension Mother   . Hypertension Father   . Seizures Father    History  Substance Use Topics  . Smoking status: Never Smoker   . Smokeless tobacco: Never Used  . Alcohol Use: 1.2 oz/week    1 Glasses of wine, 1 Cans of beer per week   OB History   Grav Para Term Preterm Abortions TAB SAB Ect Mult Living                 Review of Systems  Constitutional: Negative for fever.  HENT: Negative for congestion and sore throat.   Respiratory: Negative for shortness of breath.   Cardiovascular: Negative for chest pain.  Endocrine: Negative for polyuria.   Genitourinary: Negative for dysuria.  Musculoskeletal: Positive for myalgias. Negative for back pain, neck pain and neck stiffness.  Skin: Negative for rash.  Neurological: Negative for weakness and numbness.      Allergies  Sulfa antibiotics  Home Medications   Prior to Admission medications   Medication Sig Start Date End Date Taking? Authorizing Provider  adalimumab (HUMIRA) 40 MG/0.8ML injection Inject 40 mg into the skin once a week.    Yes Historical Provider, MD  azaTHIOprine (IMURAN) 50 MG tablet Take 50 mg by mouth daily.   Yes Historical Provider, MD  BIOTIN PO Take 2 tablets by mouth daily.   Yes Historical Provider, MD  cetirizine (ZYRTEC) 10 MG tablet Take 10 mg by mouth daily.   Yes Historical Provider, MD  cholecalciferol (VITAMIN D) 1000 UNITS tablet Take 1,000 Units by mouth daily.   Yes Historical Provider, MD  ferrous sulfate 325 (65 FE) MG tablet Take 325 mg by mouth daily with breakfast.   Yes Historical Provider, MD  folic acid (FOLVITE) 1 MG tablet Take 1 mg by mouth daily.   Yes Historical Provider, MD  predniSONE (DELTASONE) 5 MG tablet Take 5 mg by mouth daily with breakfast.   Yes Historical Provider, MD  TURMERIC PO Take 1 capsule by mouth daily.   Yes Historical Provider, MD  vitamin B-12 (CYANOCOBALAMIN) 1000 MCG tablet  Take 1,000 mcg by mouth daily.   Yes Historical Provider, MD  methocarbamol (ROBAXIN) 500 MG tablet Take 1 tablet (500 mg total) by mouth 2 (two) times daily as needed for muscle spasms. 11/14/13   Earle Gell Lanora Reveron, PA-C  naproxen (NAPROSYN) 500 MG tablet Take 1 tablet (500 mg total) by mouth 2 (two) times daily. 11/14/13   Earle Gell Marypat Kimmet, PA-C   BP 131/80  Pulse 59  Temp(Src) 98.4 F (36.9 C) (Oral)  Resp 18  SpO2 100% Physical Exam  Nursing note and vitals reviewed. Constitutional: She is oriented to person, place, and time. She appears well-developed and well-nourished.  HENT:  Head: Normocephalic and atraumatic.   Mouth/Throat: Oropharynx is clear and moist.  No meningeal signs  Eyes: Conjunctivae are normal. Pupils are equal, round, and reactive to light. Right eye exhibits no discharge. Left eye exhibits no discharge. No scleral icterus.  Neck: Neck supple.  Cardiovascular: Normal rate, regular rhythm and normal heart sounds.   Pulmonary/Chest: Effort normal and breath sounds normal. No respiratory distress. She has no wheezes. She has no rales.  Abdominal: Soft. There is no tenderness.  Musculoskeletal: She exhibits no tenderness.  Neurological: She is alert and oriented to person, place, and time.  Cranial Nerves II-XII grossly intact  Skin: Skin is warm and dry. No rash noted.  Psychiatric: She has a normal mood and affect.    ED Course  Procedures (including critical care time) Labs Review Labs Reviewed  I-STAT CHEM 8, ED - Abnormal; Notable for the following:    Sodium 136 (*)    All other components within normal limits  CK    Imaging Review No results found.   EKG Interpretation None      MDM  Vitals stable - WNL -afebrile Pt resting comfortably in ED. PE and clinical picture and not concerning for emergent pathology at this time Labwork essentially normal. No concern for rhabdomyolysis or dehydration Myalgias and fatigue symptoms likely due to a viral etiology.  Will DC with naproxen and Robaxin for muscle pain Discussed f/u with PCP and return precautions, pt very amenable to plan.   Final diagnoses:  Myalgia  Prior to patient discharge, I discussed and reviewed this case with Dr.Belfi         Sharlene Motts, PA-C 11/14/13 1156

## 2014-02-10 ENCOUNTER — Ambulatory Visit: Payer: Medicare Other | Admitting: Obstetrics & Gynecology

## 2014-03-07 ENCOUNTER — Encounter: Payer: Self-pay | Admitting: *Deleted

## 2014-03-08 ENCOUNTER — Encounter: Payer: Self-pay | Admitting: Obstetrics & Gynecology

## 2014-04-04 ENCOUNTER — Other Ambulatory Visit: Payer: Self-pay

## 2014-04-04 DIAGNOSIS — Z1231 Encounter for screening mammogram for malignant neoplasm of breast: Secondary | ICD-10-CM

## 2014-04-14 ENCOUNTER — Ambulatory Visit: Payer: Self-pay

## 2014-04-21 ENCOUNTER — Ambulatory Visit
Admission: RE | Admit: 2014-04-21 | Discharge: 2014-04-21 | Disposition: A | Discharge status: Home / Self Care | Payer: Medicare Other | Source: Ambulatory Visit

## 2014-04-21 ENCOUNTER — Encounter (INDEPENDENT_AMBULATORY_CARE_PROVIDER_SITE_OTHER): Payer: Self-pay

## 2014-04-21 DIAGNOSIS — Z1231 Encounter for screening mammogram for malignant neoplasm of breast: Secondary | ICD-10-CM

## 2015-06-01 ENCOUNTER — Other Ambulatory Visit: Payer: Self-pay

## 2015-06-01 DIAGNOSIS — Z1231 Encounter for screening mammogram for malignant neoplasm of breast: Secondary | ICD-10-CM

## 2015-06-14 ENCOUNTER — Ambulatory Visit
Admission: RE | Admit: 2015-06-14 | Discharge: 2015-06-14 | Disposition: A | Discharge status: Home / Self Care | Payer: Medicare Other | Source: Ambulatory Visit

## 2015-06-14 DIAGNOSIS — Z1231 Encounter for screening mammogram for malignant neoplasm of breast: Secondary | ICD-10-CM

## 2016-07-25 ENCOUNTER — Other Ambulatory Visit: Payer: Self-pay | Admitting: Family Medicine

## 2016-07-25 DIAGNOSIS — Z1231 Encounter for screening mammogram for malignant neoplasm of breast: Secondary | ICD-10-CM

## 2016-08-14 ENCOUNTER — Ambulatory Visit: Payer: Medicare Other

## 2016-09-04 ENCOUNTER — Ambulatory Visit
Admission: RE | Admit: 2016-09-04 | Discharge: 2016-09-04 | Disposition: A | Discharge status: Home / Self Care | Payer: Medicare Other | Source: Ambulatory Visit | Attending: Family Medicine | Admitting: Family Medicine

## 2016-09-04 DIAGNOSIS — Z1231 Encounter for screening mammogram for malignant neoplasm of breast: Secondary | ICD-10-CM

## 2017-08-26 ENCOUNTER — Other Ambulatory Visit: Payer: Self-pay | Admitting: Family Medicine

## 2017-08-26 DIAGNOSIS — Z1231 Encounter for screening mammogram for malignant neoplasm of breast: Secondary | ICD-10-CM

## 2017-09-16 ENCOUNTER — Ambulatory Visit
Admission: RE | Admit: 2017-09-16 | Discharge: 2017-09-16 | Disposition: A | Discharge status: Home / Self Care | Payer: Medicare Other | Source: Ambulatory Visit | Attending: Family Medicine | Admitting: Family Medicine

## 2017-09-16 DIAGNOSIS — Z1231 Encounter for screening mammogram for malignant neoplasm of breast: Secondary | ICD-10-CM

## 2018-04-29 ENCOUNTER — Other Ambulatory Visit: Payer: Self-pay | Admitting: Family Medicine

## 2018-09-02 ENCOUNTER — Other Ambulatory Visit: Payer: Self-pay | Admitting: Family Medicine

## 2018-09-02 DIAGNOSIS — Z1231 Encounter for screening mammogram for malignant neoplasm of breast: Secondary | ICD-10-CM

## 2018-10-13 ENCOUNTER — Other Ambulatory Visit: Payer: Self-pay

## 2018-10-13 ENCOUNTER — Ambulatory Visit
Admission: RE | Admit: 2018-10-13 | Discharge: 2018-10-13 | Disposition: A | Discharge status: Home / Self Care | Payer: Medicare Other | Source: Ambulatory Visit | Attending: Family Medicine | Admitting: Family Medicine

## 2018-10-13 DIAGNOSIS — Z1231 Encounter for screening mammogram for malignant neoplasm of breast: Secondary | ICD-10-CM

## 2018-11-04 ENCOUNTER — Other Ambulatory Visit: Payer: Self-pay

## 2018-11-04 DIAGNOSIS — Z20822 Contact with and (suspected) exposure to covid-19: Secondary | ICD-10-CM

## 2018-11-05 LAB — NOVEL CORONAVIRUS, NAA: SARS-CoV-2, NAA: NOT DETECTED

## 2019-01-28 ENCOUNTER — Other Ambulatory Visit: Payer: Self-pay

## 2019-01-28 DIAGNOSIS — Z20822 Contact with and (suspected) exposure to covid-19: Secondary | ICD-10-CM

## 2019-02-01 LAB — NOVEL CORONAVIRUS, NAA: SARS-CoV-2, NAA: NOT DETECTED

## 2019-05-31 DIAGNOSIS — E782 Mixed hyperlipidemia: Secondary | ICD-10-CM | POA: Diagnosis not present

## 2019-05-31 DIAGNOSIS — I10 Essential (primary) hypertension: Secondary | ICD-10-CM | POA: Diagnosis not present

## 2019-06-17 DIAGNOSIS — L732 Hidradenitis suppurativa: Secondary | ICD-10-CM | POA: Diagnosis not present

## 2019-08-02 DIAGNOSIS — H524 Presbyopia: Secondary | ICD-10-CM | POA: Diagnosis not present

## 2019-08-02 DIAGNOSIS — H5203 Hypermetropia, bilateral: Secondary | ICD-10-CM | POA: Diagnosis not present

## 2019-08-02 DIAGNOSIS — H25813 Combined forms of age-related cataract, bilateral: Secondary | ICD-10-CM | POA: Diagnosis not present

## 2019-08-02 DIAGNOSIS — H52203 Unspecified astigmatism, bilateral: Secondary | ICD-10-CM | POA: Diagnosis not present

## 2019-08-25 DIAGNOSIS — H25011 Cortical age-related cataract, right eye: Secondary | ICD-10-CM | POA: Diagnosis not present

## 2019-08-25 DIAGNOSIS — H2511 Age-related nuclear cataract, right eye: Secondary | ICD-10-CM | POA: Diagnosis not present

## 2019-09-15 DIAGNOSIS — M545 Low back pain: Secondary | ICD-10-CM | POA: Diagnosis not present

## 2019-09-15 DIAGNOSIS — F341 Dysthymic disorder: Secondary | ICD-10-CM | POA: Diagnosis not present

## 2019-09-15 DIAGNOSIS — M130 Polyarthritis, unspecified: Secondary | ICD-10-CM | POA: Diagnosis not present

## 2019-10-11 ENCOUNTER — Other Ambulatory Visit: Payer: Self-pay | Admitting: Family Medicine

## 2019-10-11 DIAGNOSIS — Z1231 Encounter for screening mammogram for malignant neoplasm of breast: Secondary | ICD-10-CM

## 2019-10-20 DIAGNOSIS — K5080 Crohn's disease of both small and large intestine without complications: Secondary | ICD-10-CM | POA: Diagnosis not present

## 2019-10-20 DIAGNOSIS — F064 Anxiety disorder due to known physiological condition: Secondary | ICD-10-CM | POA: Diagnosis not present

## 2019-10-20 DIAGNOSIS — R7309 Other abnormal glucose: Secondary | ICD-10-CM | POA: Diagnosis not present

## 2019-10-20 DIAGNOSIS — F341 Dysthymic disorder: Secondary | ICD-10-CM | POA: Diagnosis not present

## 2019-10-20 DIAGNOSIS — M130 Polyarthritis, unspecified: Secondary | ICD-10-CM | POA: Diagnosis not present

## 2019-10-20 DIAGNOSIS — E785 Hyperlipidemia, unspecified: Secondary | ICD-10-CM | POA: Diagnosis not present

## 2019-10-20 DIAGNOSIS — I10 Essential (primary) hypertension: Secondary | ICD-10-CM | POA: Diagnosis not present

## 2019-10-21 ENCOUNTER — Ambulatory Visit
Admission: RE | Admit: 2019-10-21 | Discharge: 2019-10-21 | Disposition: A | Discharge status: Home / Self Care | Payer: Medicare PPO | Source: Ambulatory Visit | Attending: Family Medicine | Admitting: Family Medicine

## 2019-10-21 ENCOUNTER — Other Ambulatory Visit: Payer: Self-pay

## 2019-10-21 DIAGNOSIS — Z1231 Encounter for screening mammogram for malignant neoplasm of breast: Secondary | ICD-10-CM

## 2019-12-16 DIAGNOSIS — Z79899 Other long term (current) drug therapy: Secondary | ICD-10-CM | POA: Diagnosis not present

## 2019-12-16 DIAGNOSIS — L732 Hidradenitis suppurativa: Secondary | ICD-10-CM | POA: Diagnosis not present

## 2019-12-16 DIAGNOSIS — Z9889 Other specified postprocedural states: Secondary | ICD-10-CM | POA: Diagnosis not present

## 2019-12-16 DIAGNOSIS — K50812 Crohn's disease of both small and large intestine with intestinal obstruction: Secondary | ICD-10-CM | POA: Diagnosis not present

## 2019-12-22 DIAGNOSIS — K50812 Crohn's disease of both small and large intestine with intestinal obstruction: Secondary | ICD-10-CM | POA: Diagnosis not present

## 2020-01-13 DIAGNOSIS — Z961 Presence of intraocular lens: Secondary | ICD-10-CM | POA: Diagnosis not present

## 2020-01-13 DIAGNOSIS — H25812 Combined forms of age-related cataract, left eye: Secondary | ICD-10-CM | POA: Diagnosis not present

## 2020-01-20 DIAGNOSIS — K5080 Crohn's disease of both small and large intestine without complications: Secondary | ICD-10-CM | POA: Diagnosis not present

## 2020-01-20 DIAGNOSIS — I10 Essential (primary) hypertension: Secondary | ICD-10-CM | POA: Diagnosis not present

## 2020-01-20 DIAGNOSIS — D649 Anemia, unspecified: Secondary | ICD-10-CM | POA: Diagnosis not present

## 2020-01-20 DIAGNOSIS — E782 Mixed hyperlipidemia: Secondary | ICD-10-CM | POA: Diagnosis not present

## 2020-05-01 ENCOUNTER — Other Ambulatory Visit: Payer: Self-pay | Admitting: Family Medicine

## 2020-05-01 DIAGNOSIS — F064 Anxiety disorder due to known physiological condition: Secondary | ICD-10-CM | POA: Diagnosis not present

## 2020-05-01 DIAGNOSIS — R1032 Left lower quadrant pain: Secondary | ICD-10-CM | POA: Diagnosis not present

## 2020-05-01 DIAGNOSIS — I10 Essential (primary) hypertension: Secondary | ICD-10-CM | POA: Diagnosis not present

## 2020-05-01 DIAGNOSIS — E785 Hyperlipidemia, unspecified: Secondary | ICD-10-CM | POA: Diagnosis not present

## 2020-05-01 DIAGNOSIS — K5080 Crohn's disease of both small and large intestine without complications: Secondary | ICD-10-CM | POA: Diagnosis not present

## 2020-05-02 ENCOUNTER — Other Ambulatory Visit: Payer: Self-pay | Admitting: Family Medicine

## 2020-05-02 DIAGNOSIS — K50919 Crohn's disease, unspecified, with unspecified complications: Secondary | ICD-10-CM

## 2020-05-02 DIAGNOSIS — R1032 Left lower quadrant pain: Secondary | ICD-10-CM

## 2020-05-04 ENCOUNTER — Other Ambulatory Visit: Payer: Self-pay

## 2020-05-04 ENCOUNTER — Ambulatory Visit
Admission: RE | Admit: 2020-05-04 | Discharge: 2020-05-04 | Disposition: A | Discharge status: Home / Self Care | Payer: Medicare PPO | Source: Ambulatory Visit | Attending: Family Medicine | Admitting: Family Medicine

## 2020-05-04 DIAGNOSIS — K3189 Other diseases of stomach and duodenum: Secondary | ICD-10-CM | POA: Diagnosis not present

## 2020-05-04 DIAGNOSIS — L0390 Cellulitis, unspecified: Secondary | ICD-10-CM | POA: Diagnosis not present

## 2020-05-04 DIAGNOSIS — K50919 Crohn's disease, unspecified, with unspecified complications: Secondary | ICD-10-CM

## 2020-05-04 DIAGNOSIS — K6389 Other specified diseases of intestine: Secondary | ICD-10-CM | POA: Diagnosis not present

## 2020-05-04 DIAGNOSIS — K449 Diaphragmatic hernia without obstruction or gangrene: Secondary | ICD-10-CM | POA: Diagnosis not present

## 2020-05-04 DIAGNOSIS — R1032 Left lower quadrant pain: Secondary | ICD-10-CM

## 2020-05-04 MED ORDER — IOPAMIDOL (ISOVUE-300) INJECTION 61%
100.0000 mL | Freq: Once | INTRAVENOUS | Status: AC | PRN
  Administered 2020-05-04: 100 mL via INTRAVENOUS

## 2020-05-10 DIAGNOSIS — L988 Other specified disorders of the skin and subcutaneous tissue: Secondary | ICD-10-CM | POA: Diagnosis not present

## 2020-05-10 DIAGNOSIS — K5090 Crohn's disease, unspecified, without complications: Secondary | ICD-10-CM | POA: Diagnosis not present

## 2020-05-10 DIAGNOSIS — L732 Hidradenitis suppurativa: Secondary | ICD-10-CM | POA: Diagnosis not present

## 2020-06-20 DIAGNOSIS — L732 Hidradenitis suppurativa: Secondary | ICD-10-CM | POA: Diagnosis not present

## 2020-06-20 DIAGNOSIS — K50812 Crohn's disease of both small and large intestine with intestinal obstruction: Secondary | ICD-10-CM | POA: Diagnosis not present

## 2020-06-20 DIAGNOSIS — Z9049 Acquired absence of other specified parts of digestive tract: Secondary | ICD-10-CM | POA: Diagnosis not present

## 2020-06-20 DIAGNOSIS — K624 Stenosis of anus and rectum: Secondary | ICD-10-CM | POA: Diagnosis not present

## 2020-06-20 DIAGNOSIS — D509 Iron deficiency anemia, unspecified: Secondary | ICD-10-CM | POA: Diagnosis not present

## 2020-07-12 DIAGNOSIS — K5080 Crohn's disease of both small and large intestine without complications: Secondary | ICD-10-CM | POA: Diagnosis not present

## 2020-07-26 DIAGNOSIS — K5080 Crohn's disease of both small and large intestine without complications: Secondary | ICD-10-CM | POA: Diagnosis not present

## 2020-08-09 DIAGNOSIS — L988 Other specified disorders of the skin and subcutaneous tissue: Secondary | ICD-10-CM | POA: Diagnosis not present

## 2020-08-09 DIAGNOSIS — K5090 Crohn's disease, unspecified, without complications: Secondary | ICD-10-CM | POA: Diagnosis not present

## 2020-08-09 DIAGNOSIS — L732 Hidradenitis suppurativa: Secondary | ICD-10-CM | POA: Diagnosis not present

## 2020-08-23 DIAGNOSIS — D638 Anemia in other chronic diseases classified elsewhere: Secondary | ICD-10-CM | POA: Diagnosis not present

## 2020-08-23 DIAGNOSIS — K5080 Crohn's disease of both small and large intestine without complications: Secondary | ICD-10-CM | POA: Diagnosis not present

## 2020-10-18 DIAGNOSIS — K5080 Crohn's disease of both small and large intestine without complications: Secondary | ICD-10-CM | POA: Diagnosis not present

## 2020-10-24 ENCOUNTER — Other Ambulatory Visit: Payer: Self-pay | Admitting: Family Medicine

## 2020-10-24 DIAGNOSIS — Z1231 Encounter for screening mammogram for malignant neoplasm of breast: Secondary | ICD-10-CM

## 2020-10-26 DIAGNOSIS — D649 Anemia, unspecified: Secondary | ICD-10-CM | POA: Diagnosis not present

## 2020-10-26 DIAGNOSIS — L732 Hidradenitis suppurativa: Secondary | ICD-10-CM | POA: Diagnosis not present

## 2020-10-26 DIAGNOSIS — K50812 Crohn's disease of both small and large intestine with intestinal obstruction: Secondary | ICD-10-CM | POA: Diagnosis not present

## 2020-10-26 DIAGNOSIS — Z79899 Other long term (current) drug therapy: Secondary | ICD-10-CM | POA: Diagnosis not present

## 2020-11-09 ENCOUNTER — Other Ambulatory Visit: Payer: Self-pay

## 2020-11-09 ENCOUNTER — Ambulatory Visit
Admission: RE | Admit: 2020-11-09 | Discharge: 2020-11-09 | Disposition: A | Discharge status: Home / Self Care | Payer: Medicare PPO | Source: Ambulatory Visit | Attending: Family Medicine | Admitting: Family Medicine

## 2020-11-09 DIAGNOSIS — Z1231 Encounter for screening mammogram for malignant neoplasm of breast: Secondary | ICD-10-CM

## 2020-11-30 DIAGNOSIS — J301 Allergic rhinitis due to pollen: Secondary | ICD-10-CM | POA: Diagnosis not present

## 2020-11-30 DIAGNOSIS — F064 Anxiety disorder due to known physiological condition: Secondary | ICD-10-CM | POA: Diagnosis not present

## 2020-11-30 DIAGNOSIS — E785 Hyperlipidemia, unspecified: Secondary | ICD-10-CM | POA: Diagnosis not present

## 2020-11-30 DIAGNOSIS — K5080 Crohn's disease of both small and large intestine without complications: Secondary | ICD-10-CM | POA: Diagnosis not present

## 2020-11-30 DIAGNOSIS — I10 Essential (primary) hypertension: Secondary | ICD-10-CM | POA: Diagnosis not present

## 2020-11-30 DIAGNOSIS — Z0001 Encounter for general adult medical examination with abnormal findings: Secondary | ICD-10-CM | POA: Diagnosis not present

## 2020-11-30 DIAGNOSIS — J310 Chronic rhinitis: Secondary | ICD-10-CM | POA: Diagnosis not present

## 2020-12-13 DIAGNOSIS — D638 Anemia in other chronic diseases classified elsewhere: Secondary | ICD-10-CM | POA: Diagnosis not present

## 2020-12-13 DIAGNOSIS — K5080 Crohn's disease of both small and large intestine without complications: Secondary | ICD-10-CM | POA: Diagnosis not present

## 2020-12-13 DIAGNOSIS — D649 Anemia, unspecified: Secondary | ICD-10-CM | POA: Diagnosis not present

## 2020-12-13 DIAGNOSIS — Z23 Encounter for immunization: Secondary | ICD-10-CM | POA: Diagnosis not present

## 2020-12-14 DIAGNOSIS — E785 Hyperlipidemia, unspecified: Secondary | ICD-10-CM | POA: Diagnosis not present

## 2020-12-14 DIAGNOSIS — I10 Essential (primary) hypertension: Secondary | ICD-10-CM | POA: Diagnosis not present

## 2020-12-14 DIAGNOSIS — F064 Anxiety disorder due to known physiological condition: Secondary | ICD-10-CM | POA: Diagnosis not present

## 2020-12-14 DIAGNOSIS — K5080 Crohn's disease of both small and large intestine without complications: Secondary | ICD-10-CM | POA: Diagnosis not present

## 2020-12-14 DIAGNOSIS — J309 Allergic rhinitis, unspecified: Secondary | ICD-10-CM | POA: Diagnosis not present

## 2020-12-14 DIAGNOSIS — Z6833 Body mass index (BMI) 33.0-33.9, adult: Secondary | ICD-10-CM | POA: Diagnosis not present

## 2021-01-22 DIAGNOSIS — L732 Hidradenitis suppurativa: Secondary | ICD-10-CM | POA: Diagnosis not present

## 2021-01-22 DIAGNOSIS — L988 Other specified disorders of the skin and subcutaneous tissue: Secondary | ICD-10-CM | POA: Diagnosis not present

## 2021-01-22 DIAGNOSIS — K5090 Crohn's disease, unspecified, without complications: Secondary | ICD-10-CM | POA: Diagnosis not present

## 2021-02-15 DIAGNOSIS — K5080 Crohn's disease of both small and large intestine without complications: Secondary | ICD-10-CM | POA: Diagnosis not present

## 2021-04-16 DIAGNOSIS — I10 Essential (primary) hypertension: Secondary | ICD-10-CM | POA: Diagnosis not present

## 2021-04-16 DIAGNOSIS — M130 Polyarthritis, unspecified: Secondary | ICD-10-CM | POA: Diagnosis not present

## 2021-04-16 DIAGNOSIS — E785 Hyperlipidemia, unspecified: Secondary | ICD-10-CM | POA: Diagnosis not present

## 2021-04-16 DIAGNOSIS — Z6833 Body mass index (BMI) 33.0-33.9, adult: Secondary | ICD-10-CM | POA: Diagnosis not present

## 2021-04-16 DIAGNOSIS — F341 Dysthymic disorder: Secondary | ICD-10-CM | POA: Diagnosis not present

## 2021-04-24 DIAGNOSIS — K5080 Crohn's disease of both small and large intestine without complications: Secondary | ICD-10-CM | POA: Diagnosis not present

## 2021-04-24 DIAGNOSIS — D638 Anemia in other chronic diseases classified elsewhere: Secondary | ICD-10-CM | POA: Diagnosis not present

## 2021-05-10 DIAGNOSIS — D649 Anemia, unspecified: Secondary | ICD-10-CM | POA: Diagnosis not present

## 2021-05-10 DIAGNOSIS — E538 Deficiency of other specified B group vitamins: Secondary | ICD-10-CM | POA: Diagnosis not present

## 2021-05-10 DIAGNOSIS — L732 Hidradenitis suppurativa: Secondary | ICD-10-CM | POA: Diagnosis not present

## 2021-05-10 DIAGNOSIS — K50812 Crohn's disease of both small and large intestine with intestinal obstruction: Secondary | ICD-10-CM | POA: Diagnosis not present

## 2021-05-10 DIAGNOSIS — K5090 Crohn's disease, unspecified, without complications: Secondary | ICD-10-CM | POA: Diagnosis not present

## 2021-05-10 DIAGNOSIS — D638 Anemia in other chronic diseases classified elsewhere: Secondary | ICD-10-CM | POA: Diagnosis not present

## 2021-06-19 DIAGNOSIS — K5080 Crohn's disease of both small and large intestine without complications: Secondary | ICD-10-CM | POA: Diagnosis not present

## 2021-06-26 DIAGNOSIS — L732 Hidradenitis suppurativa: Secondary | ICD-10-CM | POA: Diagnosis not present

## 2021-06-26 DIAGNOSIS — K5090 Crohn's disease, unspecified, without complications: Secondary | ICD-10-CM | POA: Diagnosis not present

## 2021-06-26 DIAGNOSIS — L988 Other specified disorders of the skin and subcutaneous tissue: Secondary | ICD-10-CM | POA: Diagnosis not present

## 2021-07-24 DIAGNOSIS — E7800 Pure hypercholesterolemia, unspecified: Secondary | ICD-10-CM | POA: Diagnosis not present

## 2021-07-24 DIAGNOSIS — F064 Anxiety disorder due to known physiological condition: Secondary | ICD-10-CM | POA: Diagnosis not present

## 2021-07-24 DIAGNOSIS — Z6833 Body mass index (BMI) 33.0-33.9, adult: Secondary | ICD-10-CM | POA: Diagnosis not present

## 2021-07-24 DIAGNOSIS — I10 Essential (primary) hypertension: Secondary | ICD-10-CM | POA: Diagnosis not present

## 2021-07-24 DIAGNOSIS — R7303 Prediabetes: Secondary | ICD-10-CM | POA: Diagnosis not present

## 2021-07-24 DIAGNOSIS — K5090 Crohn's disease, unspecified, without complications: Secondary | ICD-10-CM | POA: Diagnosis not present

## 2021-07-24 DIAGNOSIS — F341 Dysthymic disorder: Secondary | ICD-10-CM | POA: Diagnosis not present

## 2021-07-24 DIAGNOSIS — K5080 Crohn's disease of both small and large intestine without complications: Secondary | ICD-10-CM | POA: Diagnosis not present

## 2021-07-24 DIAGNOSIS — E538 Deficiency of other specified B group vitamins: Secondary | ICD-10-CM | POA: Diagnosis not present

## 2021-07-24 DIAGNOSIS — L732 Hidradenitis suppurativa: Secondary | ICD-10-CM | POA: Diagnosis not present

## 2021-08-22 DIAGNOSIS — K5080 Crohn's disease of both small and large intestine without complications: Secondary | ICD-10-CM | POA: Diagnosis not present

## 2021-09-04 DIAGNOSIS — I10 Essential (primary) hypertension: Secondary | ICD-10-CM | POA: Diagnosis not present

## 2021-09-04 DIAGNOSIS — E785 Hyperlipidemia, unspecified: Secondary | ICD-10-CM | POA: Diagnosis not present

## 2021-09-04 DIAGNOSIS — D644 Congenital dyserythropoietic anemia: Secondary | ICD-10-CM | POA: Diagnosis not present

## 2021-09-10 ENCOUNTER — Other Ambulatory Visit: Payer: Self-pay | Admitting: Family Medicine

## 2021-09-10 DIAGNOSIS — Z13820 Encounter for screening for osteoporosis: Secondary | ICD-10-CM

## 2021-10-01 DIAGNOSIS — K5080 Crohn's disease of both small and large intestine without complications: Secondary | ICD-10-CM | POA: Diagnosis not present

## 2021-10-01 DIAGNOSIS — L732 Hidradenitis suppurativa: Secondary | ICD-10-CM | POA: Diagnosis not present

## 2021-11-14 DIAGNOSIS — K5080 Crohn's disease of both small and large intestine without complications: Secondary | ICD-10-CM | POA: Diagnosis not present

## 2021-12-17 DIAGNOSIS — L732 Hidradenitis suppurativa: Secondary | ICD-10-CM | POA: Diagnosis not present

## 2021-12-17 DIAGNOSIS — K5090 Crohn's disease, unspecified, without complications: Secondary | ICD-10-CM | POA: Diagnosis not present

## 2021-12-17 DIAGNOSIS — L988 Other specified disorders of the skin and subcutaneous tissue: Secondary | ICD-10-CM | POA: Diagnosis not present

## 2021-12-19 DIAGNOSIS — R7303 Prediabetes: Secondary | ICD-10-CM | POA: Diagnosis not present

## 2021-12-19 DIAGNOSIS — I10 Essential (primary) hypertension: Secondary | ICD-10-CM | POA: Diagnosis not present

## 2021-12-19 DIAGNOSIS — E782 Mixed hyperlipidemia: Secondary | ICD-10-CM | POA: Diagnosis not present

## 2021-12-19 DIAGNOSIS — F064 Anxiety disorder due to known physiological condition: Secondary | ICD-10-CM | POA: Diagnosis not present

## 2021-12-24 DIAGNOSIS — K5080 Crohn's disease of both small and large intestine without complications: Secondary | ICD-10-CM | POA: Diagnosis not present

## 2022-01-01 ENCOUNTER — Other Ambulatory Visit: Payer: Self-pay | Admitting: Family Medicine

## 2022-01-01 DIAGNOSIS — Z1231 Encounter for screening mammogram for malignant neoplasm of breast: Secondary | ICD-10-CM

## 2022-01-21 DIAGNOSIS — K5080 Crohn's disease of both small and large intestine without complications: Secondary | ICD-10-CM | POA: Diagnosis not present

## 2022-01-21 DIAGNOSIS — L732 Hidradenitis suppurativa: Secondary | ICD-10-CM | POA: Diagnosis not present

## 2022-02-18 DIAGNOSIS — K5080 Crohn's disease of both small and large intestine without complications: Secondary | ICD-10-CM | POA: Diagnosis not present

## 2022-02-22 ENCOUNTER — Ambulatory Visit
Admission: RE | Admit: 2022-02-22 | Discharge: 2022-02-22 | Disposition: A | Discharge status: Home / Self Care | Payer: Medicare PPO | Source: Ambulatory Visit | Attending: Family Medicine | Admitting: Family Medicine

## 2022-02-22 DIAGNOSIS — Z1231 Encounter for screening mammogram for malignant neoplasm of breast: Secondary | ICD-10-CM

## 2022-02-28 ENCOUNTER — Other Ambulatory Visit: Payer: Self-pay | Admitting: Family Medicine

## 2022-02-28 DIAGNOSIS — R928 Other abnormal and inconclusive findings on diagnostic imaging of breast: Secondary | ICD-10-CM

## 2022-03-18 DIAGNOSIS — L732 Hidradenitis suppurativa: Secondary | ICD-10-CM | POA: Diagnosis not present

## 2022-03-18 DIAGNOSIS — K5080 Crohn's disease of both small and large intestine without complications: Secondary | ICD-10-CM | POA: Diagnosis not present

## 2022-03-20 ENCOUNTER — Ambulatory Visit
Admission: RE | Admit: 2022-03-20 | Discharge: 2022-03-20 | Disposition: A | Discharge status: Home / Self Care | Payer: Medicare PPO | Source: Ambulatory Visit | Attending: Family Medicine | Admitting: Family Medicine

## 2022-03-20 DIAGNOSIS — Z13820 Encounter for screening for osteoporosis: Secondary | ICD-10-CM

## 2022-03-20 DIAGNOSIS — Z780 Asymptomatic menopausal state: Secondary | ICD-10-CM | POA: Diagnosis not present

## 2022-03-20 DIAGNOSIS — M8589 Other specified disorders of bone density and structure, multiple sites: Secondary | ICD-10-CM | POA: Diagnosis not present

## 2022-04-01 DIAGNOSIS — R921 Mammographic calcification found on diagnostic imaging of breast: Secondary | ICD-10-CM | POA: Diagnosis not present

## 2022-04-01 DIAGNOSIS — D649 Anemia, unspecified: Secondary | ICD-10-CM | POA: Diagnosis not present

## 2022-04-01 DIAGNOSIS — M80872S Other osteoporosis with current pathological fracture, left ankle and foot, sequela: Secondary | ICD-10-CM | POA: Diagnosis not present

## 2022-04-01 DIAGNOSIS — Z6835 Body mass index (BMI) 35.0-35.9, adult: Secondary | ICD-10-CM | POA: Diagnosis not present

## 2022-04-01 DIAGNOSIS — R7303 Prediabetes: Secondary | ICD-10-CM | POA: Diagnosis not present

## 2022-04-01 DIAGNOSIS — I10 Essential (primary) hypertension: Secondary | ICD-10-CM | POA: Diagnosis not present

## 2022-04-01 DIAGNOSIS — E559 Vitamin D deficiency, unspecified: Secondary | ICD-10-CM | POA: Diagnosis not present

## 2022-04-05 ENCOUNTER — Other Ambulatory Visit: Payer: Self-pay | Admitting: Family Medicine

## 2022-04-05 ENCOUNTER — Encounter: Payer: Self-pay | Admitting: Family Medicine

## 2022-04-05 ENCOUNTER — Ambulatory Visit
Admission: RE | Admit: 2022-04-05 | Discharge: 2022-04-05 | Disposition: A | Discharge status: Home / Self Care | Payer: Medicare PPO | Source: Ambulatory Visit | Attending: Family Medicine | Admitting: Family Medicine

## 2022-04-05 DIAGNOSIS — R921 Mammographic calcification found on diagnostic imaging of breast: Secondary | ICD-10-CM

## 2022-04-05 DIAGNOSIS — R928 Other abnormal and inconclusive findings on diagnostic imaging of breast: Secondary | ICD-10-CM

## 2022-04-09 DIAGNOSIS — M8580 Other specified disorders of bone density and structure, unspecified site: Secondary | ICD-10-CM | POA: Diagnosis not present

## 2022-04-09 DIAGNOSIS — D509 Iron deficiency anemia, unspecified: Secondary | ICD-10-CM | POA: Diagnosis not present

## 2022-04-09 DIAGNOSIS — L732 Hidradenitis suppurativa: Secondary | ICD-10-CM | POA: Diagnosis not present

## 2022-04-09 DIAGNOSIS — K5080 Crohn's disease of both small and large intestine without complications: Secondary | ICD-10-CM | POA: Diagnosis not present

## 2022-04-09 DIAGNOSIS — Z79899 Other long term (current) drug therapy: Secondary | ICD-10-CM | POA: Diagnosis not present

## 2022-04-09 DIAGNOSIS — Z79631 Long term (current) use of antimetabolite agent: Secondary | ICD-10-CM | POA: Diagnosis not present

## 2022-04-09 DIAGNOSIS — K50918 Crohn's disease, unspecified, with other complication: Secondary | ICD-10-CM | POA: Diagnosis not present

## 2022-04-09 DIAGNOSIS — Z79624 Long term (current) use of inhibitors of nucleotide synthesis: Secondary | ICD-10-CM | POA: Diagnosis not present

## 2022-04-11 ENCOUNTER — Other Ambulatory Visit: Payer: Self-pay | Admitting: Family Medicine

## 2022-04-11 DIAGNOSIS — R921 Mammographic calcification found on diagnostic imaging of breast: Secondary | ICD-10-CM

## 2022-04-11 DIAGNOSIS — R928 Other abnormal and inconclusive findings on diagnostic imaging of breast: Secondary | ICD-10-CM

## 2022-04-15 ENCOUNTER — Ambulatory Visit
Admission: RE | Admit: 2022-04-15 | Discharge: 2022-04-15 | Disposition: A | Discharge status: Home / Self Care | Payer: Medicare PPO | Source: Ambulatory Visit | Attending: Family Medicine | Admitting: Family Medicine

## 2022-04-15 DIAGNOSIS — R928 Other abnormal and inconclusive findings on diagnostic imaging of breast: Secondary | ICD-10-CM

## 2022-04-15 DIAGNOSIS — R921 Mammographic calcification found on diagnostic imaging of breast: Secondary | ICD-10-CM

## 2022-04-15 DIAGNOSIS — N6322 Unspecified lump in the left breast, upper inner quadrant: Secondary | ICD-10-CM | POA: Diagnosis not present

## 2022-04-15 HISTORY — PX: BREAST BIOPSY: SHX20

## 2022-05-01 DIAGNOSIS — Z79620 Long term (current) use of immunosuppressive biologic: Secondary | ICD-10-CM | POA: Diagnosis not present

## 2022-05-01 DIAGNOSIS — K5080 Crohn's disease of both small and large intestine without complications: Secondary | ICD-10-CM | POA: Diagnosis not present

## 2022-05-30 DIAGNOSIS — K5080 Crohn's disease of both small and large intestine without complications: Secondary | ICD-10-CM | POA: Diagnosis not present

## 2022-07-03 DIAGNOSIS — K5080 Crohn's disease of both small and large intestine without complications: Secondary | ICD-10-CM | POA: Diagnosis not present

## 2022-07-10 ENCOUNTER — Other Ambulatory Visit (INDEPENDENT_AMBULATORY_CARE_PROVIDER_SITE_OTHER): Payer: Medicare PPO

## 2022-07-10 ENCOUNTER — Ambulatory Visit: Payer: Medicare PPO | Admitting: Family

## 2022-07-10 DIAGNOSIS — M25562 Pain in left knee: Secondary | ICD-10-CM

## 2022-07-10 DIAGNOSIS — M545 Low back pain, unspecified: Secondary | ICD-10-CM

## 2022-07-10 MED ORDER — PREDNISONE 50 MG PO TABS: ORAL_TABLET | ORAL | 0 refills | Status: DC

## 2022-07-10 NOTE — Progress Notes (Signed)
Office Visit Note   Patient: Maria Hampton           Date of Birth: 1945/12/25           MRN: [id number] Visit Date: 07/10/2022              Requested by: Renaye Rakers, MD [address] [place],  [place] [address] PCP: Renaye Rakers, MD  Chief Complaint  Patient presents with   Lower Back - Pain   Left Knee - Pain      HPI: The patient is a 77 year old woman who presents today with a 3-week history of left-sided leg pain she has had pain about the knee this also radiates up and down the entire leg she has difficulty flexing the left knee a fall associated with the pain about a week ago since then has had worse symptoms.  She does complain of swelling and tightness about bilateral knees a history of weakness in bilateral ankles.  Endorses a history of chronic low back pain this is across her low back and upper buttock which radiates down the posterior aspect of her bilateral legs.  She denies any associated numbness or tingling no weakness  She has gotten mild relief with Tylenol she does not have a history of diabetes.  Assessment & Plan: Visit Diagnoses:  1. Acute bilateral low back pain, unspecified whether sciatica present   2. Acute pain of left knee     Plan: Lumbar radiculopathy bilateral.  Placed on a prednisone burst.  Given the muscle relaxer as well if we cannot get any relief with prednisone consider lumbar MRI.  Consider referral to Dr. Alvester Morin  Follow-Up Instructions: No follow-ups on file.   Left Knee Exam   Tenderness  The patient is experiencing tenderness in the medial joint line and lateral joint line.  Other  Erythema: absent Swelling: none Effusion: no effusion present   Back Exam   Tenderness  The patient is experiencing no tenderness.   Range of Motion  The patient has normal back ROM.  Muscle Strength  The patient has normal back strength.  Tests  Straight leg raise right: positive Straight leg raise left:  positive      Patient is alert, oriented, no adenopathy, well-dressed, normal affect, normal respiratory effort.   Imaging: No results found. No images are attached to the encounter.  Labs: No results found for: "HGBA1C", "ESRSEDRATE", "CRP", "LABURIC", "REPTSTATUS", "GRAMSTAIN", "CULT", "LABORGA"   No results found for: "ALBUMIN", "PREALBUMIN", "CBC"  No results found for: "MG" No results found for: "VD25OH"  No results found for: "PREALBUMIN"    Latest Ref Rng & Units 11/14/2013   11:13 AM  CBC EXTENDED  Hemoglobin 12.0 - 15.0 g/dL 29.5   HCT 18.8 - 41.6 % 39.0      There is no height or weight on file to calculate BMI.  Orders:  Orders Placed This Encounter  Procedures   XR Lumbar Spine 2-3 Views   XR Knee 1-2 Views Left   No orders of the defined types were placed in this encounter.    Procedures: No procedures performed  Clinical Data: No additional findings.  ROS:  All other systems negative, except as noted in the HPI. Review of Systems  Objective: Vital Signs: There were no vitals taken for this visit.  Specialty Comments:  No specialty comments available.  PMFS History: There are no problems to display for this patient.  Past Medical History:  Diagnosis Date  Anemia    Vitamin D deficiency     Family History  Problem Relation Age of Onset   Hypertension Mother    Hypertension Father    Seizures Father     Past Surgical History:  Procedure Laterality Date   bowel reconnection     x2   BREAST BIOPSY Left 04/15/2022   MM LT BREAST BX W LOC DEV 1ST LESION IMAGE BX SPEC STEREO GUIDE 04/15/2022 [facility]   TUBAL LIGATION     Social History   Occupational History   Not on file  Tobacco Use   Smoking status: Never   Smokeless tobacco: Never  Substance and Sexual Activity   Alcohol use: Yes    Alcohol/week: 2.0 standard drinks of alcohol    Types: 1 Glasses of wine, 1 Cans of beer per week   Drug use: No   Sexual  activity: Yes    Partners: Male    Birth control/protection: None

## 2022-07-12 ENCOUNTER — Telehealth: Payer: Self-pay

## 2022-07-12 NOTE — Telephone Encounter (Signed)
VOB submitted for Monovisc, bilateral knee  

## 2022-07-12 NOTE — Telephone Encounter (Signed)
-----   Message from Adonis Huguenin, NP sent at 07/10/2022  4:02 PM EDT ----- Bilateral knee supp injections please

## 2022-07-16 ENCOUNTER — Telehealth: Payer: Self-pay | Admitting: Family

## 2022-07-16 NOTE — Telephone Encounter (Signed)
Pt called requesting a different medication for back pains. Pt states she seen PA Erin and prescribed prednisone but it did not help that much. Please call pt about this matter at [phone]

## 2022-07-17 MED ORDER — TRAMADOL HCL 50 MG PO TABS: 50.0000 mg | ORAL_TABLET | Freq: Four times a day (QID) | ORAL | 0 refills | Status: DC | PRN

## 2022-07-19 ENCOUNTER — Encounter: Payer: Self-pay | Admitting: Family

## 2022-07-29 ENCOUNTER — Telehealth: Payer: Self-pay | Admitting: Family

## 2022-07-29 NOTE — Telephone Encounter (Signed)
Patient is still in pain in the knee stated to be possible coming from her lower back the next step Denny Peon was was a MRI and a referral to see Alvester Morin pt would like to know more about what Alvester Morin can do for her before scheduling she would like a call about that, also patient is extremely Claustrophobic and will need a open MRI with Valium please advise

## 2022-07-30 MED ORDER — DIAZEPAM 5 MG PO TABS: 5.0000 mg | ORAL_TABLET | Freq: Four times a day (QID) | ORAL | 0 refills | Status: DC | PRN

## 2022-07-30 NOTE — Telephone Encounter (Signed)
Pt informed

## 2022-07-30 NOTE — Telephone Encounter (Signed)
Change to open mri please  Valium sent

## 2022-07-30 NOTE — Addendum Note (Signed)
Addended by: Adonis Huguenin on: 07/30/2022 08:53 AM   Modules accepted: Orders

## 2022-08-08 DIAGNOSIS — K5080 Crohn's disease of both small and large intestine without complications: Secondary | ICD-10-CM | POA: Diagnosis not present

## 2022-08-16 ENCOUNTER — Telehealth: Payer: Self-pay | Admitting: Family

## 2022-08-16 ENCOUNTER — Other Ambulatory Visit: Payer: Self-pay | Admitting: Orthopedic Surgery

## 2022-08-16 MED ORDER — TRAMADOL HCL 50 MG PO TABS: 50.0000 mg | ORAL_TABLET | Freq: Four times a day (QID) | ORAL | 0 refills | Status: DC | PRN

## 2022-08-16 NOTE — Telephone Encounter (Signed)
Patient called asked if she can get something called into her pharmacy for pain? The number to contact patient is [phone]

## 2022-08-16 NOTE — Telephone Encounter (Signed)
Will send pt to [facility] on monday

## 2022-08-16 NOTE — Telephone Encounter (Signed)
Patient asked if she can get an open MRI and a call back as soon as possible.   The number to contact patient is [phone]

## 2022-08-16 NOTE — Telephone Encounter (Signed)
Pt was in the office last month fr a vist with Erin for LBP given pred rx and advised may need to consider MRI. Please see message below and advise.

## 2022-08-19 NOTE — Telephone Encounter (Signed)
Referral emailed to [facility] at [facility]

## 2022-08-21 DIAGNOSIS — M47817 Spondylosis without myelopathy or radiculopathy, lumbosacral region: Secondary | ICD-10-CM | POA: Diagnosis not present

## 2022-08-21 DIAGNOSIS — M2578 Osteophyte, vertebrae: Secondary | ICD-10-CM | POA: Diagnosis not present

## 2022-08-21 DIAGNOSIS — M5136 Other intervertebral disc degeneration, lumbar region: Secondary | ICD-10-CM | POA: Diagnosis not present

## 2022-08-21 DIAGNOSIS — M47816 Spondylosis without myelopathy or radiculopathy, lumbar region: Secondary | ICD-10-CM | POA: Diagnosis not present

## 2022-08-21 DIAGNOSIS — M4807 Spinal stenosis, lumbosacral region: Secondary | ICD-10-CM | POA: Diagnosis not present

## 2022-08-21 DIAGNOSIS — M48061 Spinal stenosis, lumbar region without neurogenic claudication: Secondary | ICD-10-CM | POA: Diagnosis not present

## 2022-08-21 DIAGNOSIS — M5137 Other intervertebral disc degeneration, lumbosacral region: Secondary | ICD-10-CM | POA: Diagnosis not present

## 2022-09-05 DIAGNOSIS — K5080 Crohn's disease of both small and large intestine without complications: Secondary | ICD-10-CM | POA: Diagnosis not present

## 2022-09-17 DIAGNOSIS — K5080 Crohn's disease of both small and large intestine without complications: Secondary | ICD-10-CM | POA: Diagnosis not present

## 2022-09-17 DIAGNOSIS — Z9189 Other specified personal risk factors, not elsewhere classified: Secondary | ICD-10-CM | POA: Diagnosis not present

## 2022-09-17 DIAGNOSIS — L732 Hidradenitis suppurativa: Secondary | ICD-10-CM | POA: Diagnosis not present

## 2022-09-18 ENCOUNTER — Ambulatory Visit: Payer: Medicare PPO | Admitting: Family

## 2022-09-18 ENCOUNTER — Encounter: Payer: Self-pay | Admitting: Family

## 2022-09-18 DIAGNOSIS — M5416 Radiculopathy, lumbar region: Secondary | ICD-10-CM

## 2022-09-18 DIAGNOSIS — M25562 Pain in left knee: Secondary | ICD-10-CM

## 2022-09-18 MED ORDER — PREDNISONE 10 MG PO TABS: 10.0000 mg | ORAL_TABLET | Freq: Every day | ORAL | 0 refills | Status: DC

## 2022-09-18 MED ORDER — TRAMADOL HCL 50 MG PO TABS: 50.0000 mg | ORAL_TABLET | Freq: Four times a day (QID) | ORAL | 0 refills | Status: DC | PRN

## 2022-09-19 DIAGNOSIS — R921 Mammographic calcification found on diagnostic imaging of breast: Secondary | ICD-10-CM | POA: Diagnosis not present

## 2022-09-19 DIAGNOSIS — M810 Age-related osteoporosis without current pathological fracture: Secondary | ICD-10-CM | POA: Diagnosis not present

## 2022-09-19 DIAGNOSIS — E6609 Other obesity due to excess calories: Secondary | ICD-10-CM | POA: Diagnosis not present

## 2022-09-19 DIAGNOSIS — E782 Mixed hyperlipidemia: Secondary | ICD-10-CM | POA: Diagnosis not present

## 2022-09-19 DIAGNOSIS — I10 Essential (primary) hypertension: Secondary | ICD-10-CM | POA: Diagnosis not present

## 2022-09-19 DIAGNOSIS — K5080 Crohn's disease of both small and large intestine without complications: Secondary | ICD-10-CM | POA: Diagnosis not present

## 2022-09-19 DIAGNOSIS — D509 Iron deficiency anemia, unspecified: Secondary | ICD-10-CM | POA: Diagnosis not present

## 2022-09-23 ENCOUNTER — Telehealth: Payer: Self-pay

## 2022-09-23 NOTE — Telephone Encounter (Signed)
VOB submitted for Monovisc, bilateral knee  

## 2022-09-25 DIAGNOSIS — D649 Anemia, unspecified: Secondary | ICD-10-CM | POA: Diagnosis not present

## 2022-09-25 DIAGNOSIS — I10 Essential (primary) hypertension: Secondary | ICD-10-CM | POA: Diagnosis not present

## 2022-09-25 DIAGNOSIS — E782 Mixed hyperlipidemia: Secondary | ICD-10-CM | POA: Diagnosis not present

## 2022-09-25 DIAGNOSIS — E1169 Type 2 diabetes mellitus with other specified complication: Secondary | ICD-10-CM | POA: Diagnosis not present

## 2022-09-25 DIAGNOSIS — M810 Age-related osteoporosis without current pathological fracture: Secondary | ICD-10-CM | POA: Diagnosis not present

## 2022-09-27 ENCOUNTER — Ambulatory Visit (INDEPENDENT_AMBULATORY_CARE_PROVIDER_SITE_OTHER): Payer: Medicare PPO | Admitting: Physical Medicine and Rehabilitation

## 2022-09-27 DIAGNOSIS — M7918 Myalgia, other site: Secondary | ICD-10-CM

## 2022-09-27 DIAGNOSIS — M5416 Radiculopathy, lumbar region: Secondary | ICD-10-CM | POA: Diagnosis not present

## 2022-09-27 DIAGNOSIS — G8929 Other chronic pain: Secondary | ICD-10-CM

## 2022-09-27 DIAGNOSIS — M47816 Spondylosis without myelopathy or radiculopathy, lumbar region: Secondary | ICD-10-CM

## 2022-09-27 NOTE — Progress Notes (Signed)
Delva Derden - 77 y.o. female MRN [id number]  Date of birth: [birth year]  Office Visit Note: Visit Date: 09/27/2022 PCP: Renaye Rakers, MD Referred by: Renaye Rakers, MD  Subjective: Chief Complaint  Patient presents with   Lower Back - Pain   HPI: Jada Fass is a 77 y.o. female who comes in today per the request of Barnie Del, NP for evaluation of chronic, worsening and severe bilateral buttock pain radiating down lateral aspects of legs to feet, left greater than right. Pain ongoing for since May. States she wakes up at night with pain, states discomfort worsens with movement and activity. She describes pain as aching and burning sensation, currently rates as 7 out of 10. Some relief of pain with home exercise regimen, rest and use of medications. States stretching exercises at home do help to alleviate pain. She is currently taking Tramadol intermittently prescribed by Barnie Del. Recent lumbar MRI imaging done at [facility] exhibits multi level degenerative changes, most severe at L4-L5, no high grade spinal canal stenosis noted. No history of lumbar surgery/injections. Patient reports history of depression and recent high stress, she is currently caring for 77 year old and also helping make health related decisions for ex husband. Patient denies focal weakness, numbness and tingling. No recent trauma or falls.    Review of Systems  Musculoskeletal:  Positive for back pain.  Neurological:  Negative for tingling, sensory change, focal weakness and weakness.  All other systems reviewed and are negative.  Otherwise per HPI.  Assessment & Plan: Visit Diagnoses:    ICD-10-CM   1. Chronic buttock pain  M79.18    G89.29     2. Lumbar radiculopathy  M54.16     3. 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 Patient continues  to have severe pain despite good conservative therapies such as home exercise regimen, rest and use of medications. Patients clinical presentation and exam are consistent with L5 nerve pattern. I discussed treatment plan in detail including regimen of formal physical therapy and possibility of performing lumbar epidural steroid injection. I did provide patient with educational material regarding lumbar epidural steroid injection to take home and review. Would recommend performing left L4-L5 interlaminar epidural steroid injection. Patient would like some time to think about options. No red flag symptoms noted upon exam today.     Meds & Orders: No orders of the defined types were placed in this encounter.  No orders of the defined types were placed in this encounter.   Follow-up: Return if symptoms worsen or fail to improve.   Procedures: No procedures performed      Clinical History: Lumbar MRI Imaging 08/21/2022 from [facility] in [place].   She reports that she has never smoked. She has never used smokeless tobacco. No results for input(s): "HGBA1C", "LABURIC" in the last 8760 hours.  Objective:  VS:  HT:    WT:   BMI:     BP:   HR: bpm  TEMP: ( )  RESP:  Physical Exam Vitals and nursing note reviewed.  HENT:     Head: Normocephalic and atraumatic.     Right Ear: External ear normal.     Left Ear: External ear normal.     Nose: Nose normal.     Mouth/Throat:     Mouth: Mucous membranes  are moist.  Eyes:     Extraocular Movements: Extraocular movements intact.  Cardiovascular:     Rate and Rhythm: Normal rate.     Pulses: Normal pulses.  Pulmonary:     Effort: Pulmonary effort is normal.  Abdominal:     General: Abdomen is flat. There is no distension.  Musculoskeletal:        General: Tenderness present.     Cervical back: Normal range of motion.     Comments: Patient rises from seated position to standing without difficulty. Good lumbar range of motion. No pain  noted with facet loading. 5/5 strength noted with bilateral hip flexion, knee flexion/extension, ankle dorsiflexion/plantarflexion and EHL. No clonus noted bilaterally. No pain upon palpation of greater trochanters. No pain with internal/external rotation of bilateral hips. Sensation intact bilaterally. Dysesthesias noted to bilateral L5 dermatomes. Negative slump test bilaterally. Ambulates without aid, gait steady.     Skin:    General: Skin is warm and dry.     Capillary Refill: Capillary refill takes less than 2 seconds.  Neurological:     General: No focal deficit present.     Mental Status: She is alert and oriented to person, place, and time.  Psychiatric:        Mood and Affect: Mood normal.        Behavior: Behavior normal.     Ortho Exam  Imaging: No results found.  Past Medical/Family/Surgical/Social History: Medications & Allergies reviewed per EMR, new medications updated. There are no problems to display for this patient.  Past Medical History:  Diagnosis Date   Anemia    Vitamin D deficiency    Family History  Problem Relation Age of Onset   Hypertension Mother    Hypertension Father    Seizures Father    Past Surgical History:  Procedure Laterality Date   bowel reconnection     x2   BREAST BIOPSY Left 04/15/2022   MM LT BREAST BX W LOC DEV 1ST LESION IMAGE BX SPEC STEREO GUIDE 04/15/2022 [facility]   TUBAL LIGATION     Social History   Occupational History   Not on file  Tobacco Use   Smoking status: Never   Smokeless tobacco: Never  Substance and Sexual Activity   Alcohol use: Yes    Alcohol/week: 2.0 standard drinks of alcohol    Types: 1 Glasses of wine, 1 Cans of beer per week   Drug use: No   Sexual activity: Yes    Partners: Male    Birth control/protection: None

## 2022-09-27 NOTE — Progress Notes (Signed)
Functional Pain Scale - descriptive words and definitions  Moderate (4)   Constantly aware of pain, can complete ADLs with modification/sleep marginally affected at times/passive distraction is of no use, but active distraction gives some relief. Moderate range order  Average Pain  varies  Lower back pain on both sides that radiates into the legs

## 2022-09-29 ENCOUNTER — Encounter: Payer: Self-pay | Admitting: Physical Medicine and Rehabilitation

## 2022-09-30 NOTE — Progress Notes (Signed)
Office Visit Note   Patient: Maria Hampton           Date of Birth: 1945-07-28           MRN: [id number] Visit Date: 09/18/2022              Requested by: Renaye Rakers, MD [address] [place],  [place] [address] PCP: Renaye Rakers, MD  Chief Complaint  Patient presents with   Lower Back - Follow-up    MRI review      HPI: The patient is a 77 year old woman who presents today with a near 60-month history of left-sided leg pain pain about the knee as well which radiates into the posterior thigh and buttocks as well as the lower leg complaining of associated tightness and weakness.  Chronic low back pain however has not previously had radicular pain.  No numbness no weakness no tingling   today presents for MRI review   Has not gotten relief with prednisone or Tylenol  Assessment & Plan: Visit Diagnoses:  1. Acute pain of left knee   2. Radiculopathy, lumbar region     Plan: Will refer to Dr. Alvester Morin consider epidural steroid injection  Follow-Up Instructions: Return c [place].   Ortho Exam  Patient is alert, oriented, no adenopathy, well-dressed, normal affect, normal respiratory effort.   Imaging: No results found. No images are attached to the encounter.  Labs: No results found for: "HGBA1C", "ESRSEDRATE", "CRP", "LABURIC", "REPTSTATUS", "GRAMSTAIN", "CULT", "LABORGA"   No results found for: "ALBUMIN", "PREALBUMIN", "CBC"  No results found for: "MG" No results found for: "VD25OH"  No results found for: "PREALBUMIN"    Latest Ref Rng & Units 11/14/2013   11:13 AM  CBC EXTENDED  Hemoglobin 12.0 - 15.0 g/dL 13.2   HCT 44.0 - 10.2 % 39.0      There is no height or weight on file to calculate BMI.  Orders:  Orders Placed This Encounter  Procedures   Ambulatory referral to Physical Medicine Rehab   Meds ordered this encounter  Medications   predniSONE (DELTASONE) 10 MG tablet    Sig: Take 1 tablet (10 mg total) by mouth daily with  breakfast.    Dispense:  30 tablet    Refill:  0   traMADol (ULTRAM) 50 MG tablet    Sig: Take 1 tablet (50 mg total) by mouth every 6 (six) hours as needed.    Dispense:  30 tablet    Refill:  0     Procedures: No procedures performed  Clinical Data: No additional findings.  ROS:  All other systems negative, except as noted in the HPI. Review of Systems  Objective: Vital Signs: There were no vitals taken for this visit.  Specialty Comments:  Lumbar MRI Imaging 08/21/2022 from [facility] in [place].  PMFS History: There are no problems to display for this patient.  Past Medical History:  Diagnosis Date   Anemia    Vitamin D deficiency     Family History  Problem Relation Age of Onset   Hypertension Mother    Hypertension Father    Seizures Father     Past Surgical History:  Procedure Laterality Date   bowel reconnection     x2   BREAST BIOPSY Left 04/15/2022   MM LT BREAST BX W LOC DEV 1ST LESION IMAGE BX SPEC STEREO GUIDE 04/15/2022 [facility]   TUBAL LIGATION     Social History   Occupational History  Not on file  Tobacco Use   Smoking status: Never   Smokeless tobacco: Never  Substance and Sexual Activity   Alcohol use: Yes    Alcohol/week: 2.0 standard drinks of alcohol    Types: 1 Glasses of wine, 1 Cans of beer per week   Drug use: No   Sexual activity: Yes    Partners: Male    Birth control/protection: None

## 2022-10-03 DIAGNOSIS — K5080 Crohn's disease of both small and large intestine without complications: Secondary | ICD-10-CM | POA: Diagnosis not present

## 2022-10-04 DIAGNOSIS — F3289 Other specified depressive episodes: Secondary | ICD-10-CM | POA: Diagnosis not present

## 2022-10-04 DIAGNOSIS — M545 Low back pain, unspecified: Secondary | ICD-10-CM | POA: Diagnosis not present

## 2022-10-04 DIAGNOSIS — D509 Iron deficiency anemia, unspecified: Secondary | ICD-10-CM | POA: Diagnosis not present

## 2022-10-15 ENCOUNTER — Telehealth: Payer: Self-pay | Admitting: Physical Medicine and Rehabilitation

## 2022-10-15 NOTE — Telephone Encounter (Signed)
Patient called. Would like a referral for PT.

## 2022-10-25 ENCOUNTER — Telehealth: Payer: Self-pay | Admitting: Physical Medicine and Rehabilitation

## 2022-10-25 ENCOUNTER — Other Ambulatory Visit: Payer: Self-pay | Admitting: Physical Medicine and Rehabilitation

## 2022-10-25 ENCOUNTER — Telehealth: Payer: Self-pay | Admitting: Family

## 2022-10-25 DIAGNOSIS — M5416 Radiculopathy, lumbar region: Secondary | ICD-10-CM

## 2022-10-25 MED ORDER — TRAMADOL HCL 50 MG PO TABS: 50.0000 mg | ORAL_TABLET | Freq: Four times a day (QID) | ORAL | 0 refills | Status: DC | PRN

## 2022-10-25 NOTE — Telephone Encounter (Signed)
Patient called needing Rx refilled Tramadol. The number to contact patient is [phone]

## 2022-10-25 NOTE — Telephone Encounter (Signed)
Pt was refereed to [facility] after MRI. Requesting rx refill for tramadol

## 2022-10-25 NOTE — Telephone Encounter (Signed)
 Patient called. Would like a referral for PT.

## 2022-10-29 ENCOUNTER — Other Ambulatory Visit: Payer: Self-pay

## 2022-10-29 ENCOUNTER — Ambulatory Visit: Payer: Medicare PPO | Admitting: Physical Therapy

## 2022-10-29 ENCOUNTER — Encounter: Payer: Self-pay | Admitting: Physical Therapy

## 2022-10-29 DIAGNOSIS — M5459 Other low back pain: Secondary | ICD-10-CM

## 2022-10-29 DIAGNOSIS — R262 Difficulty in walking, not elsewhere classified: Secondary | ICD-10-CM | POA: Diagnosis not present

## 2022-10-29 DIAGNOSIS — M6281 Muscle weakness (generalized): Secondary | ICD-10-CM | POA: Diagnosis not present

## 2022-10-29 NOTE — Therapy (Addendum)
OUTPATIENT PHYSICAL THERAPY THORACOLUMBAR EVALUATION / DISCHARGE   Patient Name: Maria Hampton Start MRN: [id number] DOB:[birth year], 77 y.o., female Today's Date: 10/29/2022  END OF SESSION:  PT End of Session - 10/29/22 1103     Visit Number 1    Number of Visits 20    Date for PT Re-Evaluation 01/10/23    Authorization Type [employer]    Authorization - Number of Visits 12    PT Start Time 1100    PT Stop Time 1145    PT Time Calculation (min) 45 min    Activity Tolerance Patient tolerated treatment well    Behavior During Therapy WFL for tasks assessed/performed             Past Medical History:  Diagnosis Date   Anemia    Vitamin D deficiency    Past Surgical History:  Procedure Laterality Date   bowel reconnection     x2   BREAST BIOPSY Left 04/15/2022   MM LT BREAST BX W LOC DEV 1ST LESION IMAGE BX SPEC STEREO GUIDE 04/15/2022 [facility]   TUBAL LIGATION     There are no problems to display for this patient.   PCP: Renaye Rakers, MD   REFERRING PROVIDER: Juanda Chance, NP   REFERRING DIAG: [id number] (ICD-10-CM) - Lumbar radiculopathy   Rationale for Evaluation and Treatment: Rehabilitation  THERAPY DIAG:  Other low back pain  Difficulty in walking, not elsewhere classified  Muscle weakness (generalized)  ONSET DATE: May 2024  SUBJECTIVE:                                                                                                                                                                                           SUBJECTIVE STATEMENT: Pt arriving evaluation of chronic, worsening and severe bilateral buttock pain radiating down lateral aspects of legs to feet, left greater than right. Pain ongoing for since May. States she wakes up at night with pain, states discomfort worsens with movement and activity. She describes pain as aching and burning sensation,   PERTINENT HISTORY:  Anemia, vit-D deficiency, breast biopsy 2024  PAIN:   NPRS scale: 5/10 Pain location: low back and bilateral legs Pain description: hard to describe Aggravating factors: twisting/turning, sitting up from supine, standing prolonged, some household chores, bending Relieving factors: tramadol  PRECAUTIONS: None  WEIGHT BEARING RESTRICTIONS: No  FALLS:  Has patient fallen in last 6 months? No  LIVING ENVIRONMENT: Lives with: daughter and grandson Lives in: House/apartment Stairs: Yes: External: 4 steps; can reach both basement stairs 1 flight, rail on Rt Has following equipment at home: None  OCCUPATION: retired, [employer]  schools  PLOF: Independent  PATIENT GOALS: Feel better  Next MD Visit:    OBJECTIVE:   DIAGNOSTIC FINDINGS:  Recent lumbar MRI imaging done at [facility] exhibits multi level degenerative changes, most severe at L4-L5, no high grade spinal canal stenosis noted.   PATIENT SURVEYS:  10/29/22: FOTO eval:  54%  SCREENING FOR RED FLAGS: Bowel or bladder incontinence: No Cauda equina syndrome: No  COGNITION: Overall cognitive status: WFL normal      SENSATION: WFL  MUSCLE LENGTH: 10/29/22: Hamstrings: Right 75 deg; Left 68 deg   POSTURE:  rounded shoulders, forward head, and increased lumbar lordosis  PALPATION: 10/29/22:  TTP: lumbar paraspinals bilaterally  LUMBAR ROM:   AROM 10/29/22  Flexion Finger tips to ankles  Extension 20 deg. Left side pain  Right lateral flexion 30 deg  Left lateral flexion 22 deg  Right rotation Limited 25%  Left rotation Limited 25%   (Blank rows = not tested)  LOWER EXTREMITY ROM:       Right 10/29/22 Left 10/29/22  Hip flexion 110 104  Hip extension    Hip abduction    Hip adduction    Hip internal rotation    Hip external rotation    Knee flexion    Knee extension    Ankle dorsiflexion    Ankle plantarflexion    Ankle inversion    Ankle eversion     (Blank rows = not tested)  LOWER EXTREMITY MMT:    MMT Right 10/29/22 Left 10/29/22   Hip flexion 4 4-  Hip extension    Hip abduction    Hip adduction    Hip internal rotation    Hip external rotation    Knee flexion 4 4-  Knee extension 4+ 4-  Ankle dorsiflexion 5 5  Ankle plantarflexion    Ankle inversion    Ankle eversion     (Blank rows = not tested)  LUMBAR SPECIAL TESTS:  10/29/22:  Slump test: Negative  FUNCTIONAL TESTS:  10/29/22:  5 times sit to stand: 32 seconds c UE support, favoring Rt LE   GAIT: Distance walked: 30 feet level surface Assistive device utilized: None Level of assistance: Complete Independence Comments: wide BOS, slow cadence                                                                                                                                                                                                                  TODAY'S TREATMENT:  10/29/22:   Therex:    HEP instruction/performance c cues for techniques, handout provided.  Trial set performed of each for comprehension and symptom assessment.  See below for exercise list  PATIENT EDUCATION:  Education details: HEP, POC Person educated: Patient Education method: Explanation, Demonstration, Verbal cues, and Handouts Education comprehension: verbalized understanding, returned demonstration, and verbal cues required  HOME EXERCISE PROGRAM: Access Code: [email or web address] URL: [email or web address] Date: 10/29/2022 Prepared by: Narda Amber  Exercises - Supine Posterior Pelvic Tilt  - 1 x daily - 7 x weekly - 10 reps - 5 seconds hold - Supine Bridge  - 2 x daily - 5-10 reps - 5 seconds hold - Supine Lower Trunk Rotation  - 2 x daily - 3 reps - 20 seconds hold - Hooklying Single Knee to Chest Stretch  - 2 x daily - 3 sets - 3 reps - 20 seconds hold - Seated Hamstring Stretch  - 2 x daily - 3 reps - 20 seconds hold  ASSESSMENT:  CLINICAL  IMPRESSION: Patient is a 77 y.o. who comes to clinic with complaints of low back pain and bilateral gluteal pain radiating down lateral aspects of bil LE's into her feet at times. Pt stating pain is rarely in both LE at the same time and pain can vary with certain movements. Pt presenting today with left LE pain > Rt.   Pt presents with pain with mobility, strength and movement coordination deficits that impair their ability to perform usual daily and recreational functional activities without increase difficulty/symptoms at this time.  Patient to benefit from skilled PT services to address impairments and limitations to improve to previous level of function without restriction secondary to condition.   OBJECTIVE IMPAIRMENTS: decreased activity tolerance, decreased mobility, difficulty walking, decreased ROM, decreased strength, and pain.   ACTIVITY LIMITATIONS: bending, sitting, standing, squatting, sleeping, and stairs  PARTICIPATION LIMITATIONS: cleaning, laundry, and community activity  PERSONAL FACTORS: 3+ comorbidities: see pertinent history  are also affecting patient's functional outcome.   REHAB POTENTIAL: Good  CLINICAL DECISION MAKING: Evolving/moderate complexity  EVALUATION COMPLEXITY: Moderate   GOALS: Goals reviewed with patient? Yes  SHORT TERM GOALS: (target date for Short term goals are 3 weeks 11/22/22)  1. Patient will demonstrate independent use of home exercise program to maintain progress from in clinic treatments.  Goal status: New  LONG TERM GOALS: (target dates for all long term goals are 12 weeks  01/24/23 )   1. Patient will demonstrate/report pain at worst less than or equal to 2/10 to facilitate minimal limitation in daily activity secondary to pain symptoms.  Goal status: New   2. Patient will demonstrate independent use of home exercise program to facilitate ability to maintain/progress functional gains from skilled physical therapy services.  Goal  status: New   3. Patient will demonstrate FOTO outcome > or = 65 % to indicate reduced disability due to condition.  Goal status: New   4. Patient will demonstrate bilateral trunk rotation to [facility] for functional mobility with no pain noted.   Goal status: New   5.  Pt will be perform stair navigation for 1 flight of stairs c single hand rail support with pain </= 2/10 in bil LE's and low back.    Goal status: New   6.  Pt will be able to improved bilateral LE strength to grossly 5/5 in order to improved gait and functional mobility.   Goal status: New     PLAN:  PT FREQUENCY: 1-2x/week  PT DURATION:  12 weeks  PLANNED INTERVENTIONS: Therapeutic exercises, Therapeutic activity, Neuro Muscular re-education, Balance training, Gait training, Patient/Family education, Joint mobilization, Stair training, DME instructions, Dry Needling, Electrical stimulation, Cryotherapy, vasopneumatic device, Moist heat, Taping, Traction Ultrasound, Ionotophoresis 4mg /ml Dexamethasone, and aquatic therapy, Manual therapy.  All included unless contraindicated  PLAN FOR NEXT SESSION: Review HEP knowledge/results, core strengthening. Lumbar stretching    Sharmon Leyden, PT, MPT 10/29/2022, 11:05 AM    PHYSICAL THERAPY DISCHARGE SUMMARY  Visits from Start of Care: 1  Current functional level related to goals / functional outcomes: See note   Remaining deficits: See note   Education / Equipment: HEP  Patient goals were not met. Patient is being discharged due to not returning since the last visit.  Chyrel Masson, PT, DPT, OCS, ATC 12/05/22  1:50 PM     Referring diagnosis? M54.16 (ICD-10-CM) - Lumbar radiculopathy  Treatment diagnosis? (if different than referring diagnosis) M54.59, R26.2, M62.81 What was this (referring dx) caused by? []  Surgery []  Fall [x]  Ongoing issue []  Arthritis []  Other: ____________  Laterality: []  Rt []  Lt [x]  Both  Check all possible CPT  codes:  *CHOOSE 10 OR LESS*    []  97110 (Therapeutic Exercise)  []  92507 (SLP Treatment)  []  97112 (Neuro Re-ed)   []  92526 (Swallowing Treatment)   []  97116 (Gait Training)   []  [id number] (Cognitive Training, 1st 15 minutes) []  97140 (Manual Therapy)   []  97130 (Cognitive Training, each add'l 15 minutes)  []  97164 (Re-evaluation)                              []  Other, List CPT Code ____________  []  97530 (Therapeutic Activities)     []  97535 (Self Care)   [x]  All codes above (97110 - 97535)  [x]  97012 (Mechanical Traction)  [x]  97014 (E-stim Unattended)  []  97032 (E-stim manual)  []  97033 (Ionto)  []  97035 (Ultrasound) []  97750 (Physical Performance Training) []  [id number] (Aquatic Therapy) []  97016 (Vasopneumatic Device) []  [id number] (Paraffin) []  97034 (Contrast Bath) []  97597 (Wound Care 1st 20 sq cm) []  97598 (Wound Care each add'l 20 sq cm) []  97760 (Orthotic Fabrication, Fitting, Training Initial) []  [id number] (Prosthetic Management and Training Initial) []  [id number] (Orthotic or Prosthetic Training/ Modification Subsequent)

## 2022-11-01 ENCOUNTER — Other Ambulatory Visit: Payer: Self-pay | Admitting: Physician Assistant

## 2022-11-01 DIAGNOSIS — D509 Iron deficiency anemia, unspecified: Secondary | ICD-10-CM

## 2022-11-01 NOTE — Progress Notes (Unsigned)
[facility] [phone]   [phone]) [phone]  CONSULT NOTE  REFERRING PHYSICIAN: Derenda Mis   REASON FOR CONSULTATION:  Anemia   HPI Maria Hampton is a 77 y.o. female with a past medical history significant for hypertension, Crohn's disease, back pain (follows with orthopedics), and hydradenitis suppurativa is referred to the clinic for evaluation of anemia.  She had a follow-up appointment with her primary care provider on 10/04/2022 to follow-up on back pain and anxiety.  Her lab work from 09/26/2022 showed microcytic anemia with an MCV of 76.7, hemoglobin of 10.2, iron binding capacity low at 216, elevated saturation at 46% .  Her white blood cell count was within normal limits as well as her platelet count.  Her total iron was WNL at 100. The patient had been on oral iron supplements for several years.  She is compliant with her iron supplement and it does not cause any GI upset.  Her PCP told her to discontinue these as they were not helpful for her and she continued to have microcytic anemia.  Her PCP referred her to the clinic to consider IV iron.    Of note, the patient is followed by gastroenterology at [facility] for Crohn's disease.  She last saw them on 09/17/2022.  In 1985/1986, 2022, the patient has had some resections due to her Crohn's disease.  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  The patient is currently on Remicade monthly.  Her gastroenterologist is wanting to perform a colonoscopy this year.  However the patient has had some unexpected deaths in the family and is under a lot of stress.  She is wanting to wait until the upcoming funeral before proceeding with scheduling her colonoscopy.  Patient also mentions that she has a history of B12 deficiency and she was previously on B12 injections.  She is no longer on B12 injections.  She does take folic acid  every day.  To the patient's knowledge, she has had anemia "all my life".  The oldest labs available to me are from 2018, she has had stable anemia for the last several years and her hemoglobin is typically in the tens.   She has never required an IV iron infusion.  She has needed blood transfusions in the past during times where she had significant flareup of her Crohn's.  She believes her last blood transfusion was in her 50s.   Overall, the patient is feeling "good" today.  She reports fatigue but mentions that it may be worse recently due to some stress.  She denies any lightheadedness or shortness of breath. Denies any recent gingival bleeding, epistaxis, hemoptysis, hematemesis, hematochezia, melena, or hematuria.  She does not take ibuprofen. She is not on any blood thinners.  She denies any particular dietary habits such as being a vegan or vegetarian.  She does not eat a lot of red meat due to preference.  Does not crave ice chips.  She also does drink black tea.  She denies any history of bariatric surgery.  She denies any changes in her bowel habits such as constipation, abdominal pain, or blood in the stool.  She denies any family history of sickle cell trait, blood disorders, bone marrow problems, or malignancies.  Her mother had hypertension.  Her father had seizures, hypertension, and anemia.  The patient's sister has glaucoma and allergies.  Her other sister has hypertension.  She is  retired from [employer].  She is divorced.  She has 4 children.  Denies any tobacco or drug use.  She drinks beer once nightly.  HPI  Past Medical History:  Diagnosis Date   Anemia    Vitamin D deficiency     Past Surgical History:  Procedure Laterality Date   bowel reconnection     x2   BREAST BIOPSY Left 04/15/2022   MM LT BREAST BX W LOC DEV 1ST LESION IMAGE BX SPEC STEREO GUIDE 04/15/2022 [facility]   TUBAL LIGATION      Family History  Problem Relation Age of Onset    Hypertension Mother    Hypertension Father    Seizures Father     Social History Social History   Tobacco Use   Smoking status: Never   Smokeless tobacco: Never  Substance Use Topics   Alcohol use: Yes    Alcohol/week: 2.0 standard drinks of alcohol    Types: 1 Glasses of wine, 1 Cans of beer per week   Drug use: No    Allergies  Allergen Reactions   Sulfa Antibiotics Rash    Current Outpatient Medications  Medication Sig Dispense Refill   adalimumab (HUMIRA) 40 MG/0.8ML injection Inject 40 mg into the skin once a week.      azaTHIOprine (IMURAN) 50 MG tablet Take 50 mg by mouth daily.     BIOTIN PO Take 2 tablets by mouth daily.     cetirizine (ZYRTEC) 10 MG tablet Take 10 mg by mouth daily.     cholecalciferol (VITAMIN D) 1000 UNITS tablet Take 1,000 Units by mouth daily.     diazepam (VALIUM) 5 MG tablet Take 1 tablet (5 mg total) by mouth every 6 (six) hours as needed for anxiety. 3 tablet 0   ferrous sulfate 325 (65 FE) MG tablet Take 325 mg by mouth daily with breakfast.     folic acid (FOLVITE) 1 MG tablet Take 1 mg by mouth daily.     methocarbamol (ROBAXIN) 500 MG tablet Take 1 tablet (500 mg total) by mouth 2 (two) times daily as needed for muscle spasms. 20 tablet 0   naproxen (NAPROSYN) 500 MG tablet Take 1 tablet (500 mg total) by mouth 2 (two) times daily. 30 tablet 0   predniSONE (DELTASONE) 10 MG tablet Take 1 tablet (10 mg total) by mouth daily with breakfast. 30 tablet 0   predniSONE (DELTASONE) 5 MG tablet Take 5 mg by mouth daily with breakfast.     telmisartan-hydrochlorothiazide (MICARDIS HCT) 80-25 MG tablet Take by mouth.     traMADol (ULTRAM) 50 MG tablet Take 1 tablet (50 mg total) by mouth every 6 (six) hours as needed. 30 tablet 0   triamcinolone ointment (KENALOG) 0.1 % Apply topically.     TURMERIC PO Take 1 capsule by mouth daily.     vitamin B-12 (CYANOCOBALAMIN) 1000 MCG tablet Take 1,000 mcg by mouth daily.     No current  facility-administered medications for this visit.    REVIEW OF SYSTEMS:   Review of Systems  Constitutional: Positive for stable fatigue. Negative for appetite change, chills, fever and unexpected weight change.  HENT:   Negative for mouth sores, nosebleeds, sore throat and trouble swallowing.   Eyes: Negative for eye problems and icterus.  Respiratory: Negative for cough, hemoptysis, shortness of breath and wheezing.   Cardiovascular: Negative for chest pain and leg swelling.  Gastrointestinal: Negative for abdominal pain, constipation, diarrhea, nausea and vomiting.  Genitourinary: Negative  for bladder incontinence, difficulty urinating, dysuria, frequency and hematuria.   Musculoskeletal: Negative for back pain, gait problem, neck pain and neck stiffness.  Skin: Negative for itching and rash.  Neurological: Negative for dizziness, extremity weakness, gait problem, headaches, light-headedness and seizures.  Hematological: Negative for adenopathy. Does not bruise/bleed easily.  Psychiatric/Behavioral: Negative for confusion, depression and sleep disturbance. The patient is not nervous/anxious.     PHYSICAL EXAMINATION:  There were no vitals taken for this visit.  ECOG PERFORMANCE STATUS: 0  Physical Exam  Constitutional: Oriented to person, place, and time and well-developed, well-nourished, and in no distress.  HENT:  Head: Normocephalic and atraumatic.  Mouth/Throat: Oropharynx is clear and moist. No oropharyngeal exudate.  Eyes: Conjunctivae are normal. Right eye exhibits no discharge. Left eye exhibits no discharge. No scleral icterus.  Neck: Normal range of motion. Neck supple.  Cardiovascular: Normal rate, regular rhythm, normal heart sounds and intact distal pulses.   Pulmonary/Chest: Effort normal and breath sounds normal. No respiratory distress. No wheezes. No rales.  Abdominal: Soft. Bowel sounds are normal. Exhibits no distension and no mass. There is no tenderness.   Musculoskeletal: Normal range of motion. Exhibits no edema.  Lymphadenopathy:    No cervical adenopathy.  Neurological: Alert and oriented to person, place, and time. Exhibits normal muscle tone. Gait normal. Coordination normal.  Skin: Skin is warm and dry. No rash noted. Not diaphoretic. No erythema. No pallor.  Psychiatric: Mood, memory and judgment normal.  Vitals reviewed.  LABORATORY DATA: Lab Results  Component Value Date   HGB 13.3 11/14/2013   HCT 39.0 11/14/2013      Chemistry      Component Value Date/Time   NA 136 (L) 11/14/2013 1113   K 3.8 11/14/2013 1113   CL 108 11/14/2013 1113   BUN 12 11/14/2013 1113   CREATININE 1.00 11/14/2013 1113   No results found for: "CALCIUM", "ALKPHOS", "AST", "ALT", "BILITOT"     RADIOGRAPHIC STUDIES: No results found.  ASSESSMENT: This is a very pleasant 77 year old African-American female with microcytic anemia.   The patient also has Crohn's disease for which she follows with GI at [facility].  She is expected to have a colonoscopy later this year.  She has 2 prior surgeries related to her Crohn's disease.  She receives Remicade infusions every month.  The patient has several lab studies performed today including CBC, CMP, iron studies, ferritin,  folate, and B12.  She also had a heavy metals panel and hemoglobin electrophoresis.  Her lab work from today shows stable microcytic anemia with a hemoglobin of 10.7 and MCV of 73.9.  Her CMP is unremarkable.  Her iron thus far is acceptable.  Her total iron is 97.  Her TIBC is low at 234 and her saturation is elevated at 42.  May be related to chronic inflammation.  Her other lab studies are pending at this time.  We will wait for the remaining lab studies before determining next steps.  It is unlikely that the patient will require IV iron infusion unless her ferritin is low.  I let the patient know her ferritin is elevated this more so supports inflammatory/chronic  disease.  She is wondering if she should take her iron supplement.  I would recommend that if she did not have any GI upset from her oral iron supplement that it may not be a bad idea to continue taking this to maintain her levels.  If no clear cause of her anemia, then we will  recommend close monitoring and to continue to follow with her GI doctor for her Crohn's disease.  She is due for colonoscopy later this year.   I do not see the order in process for the SPEP.  I have added this onto her next set of labs.  If no clear cause of anemia, then Dr. Arbutus Ped recommends close monitoring as her anemia has been stable in the tens for several years and we would arrange for follow-up with repeat CBC and iron studies in 3 months.  The patient voices understanding of current disease status and treatment options and is in agreement with the current care plan.  All questions were answered. The patient knows to call the clinic with any problems, questions or concerns. We can certainly see the patient much sooner if necessary.  Thank you so much for allowing me to participate in the care of Maria Hampton. I will continue to follow up the patient with you and assist in her care.   Disclaimer: This note was dictated with voice recognition software. Similar sounding words can inadvertently be transcribed and may not be corrected upon review.   Sally Menard L Derric Dealmeida November 01, 2022, 2:00 PM  ADDENDUM: Hematology/Oncology Attending:  I had a face-to-face encounter with the patient today.  I reviewed her records, lab and recommended her care plan.  This is a very pleasant 77 years old African-American female with past medical history significant for Crohn's disease, hypertension, and chronic back pain.  The patient was followed by gastroenterology at [facility] for her Crohn's disease.  It is time for her to have another colonoscopy.  She was seen by her primary care  provider recently and she was noted on repeat CBC to have persistent microcytic anemia with hemoglobin of 10.2 and MCV of 76.7.  The patient was found to have normal iron saturation as well as serum iron and ferritin.  She was referred to [place] today for evaluation and recommendation regarding her condition. Will order several studies today including repeat CBC that showed hemoglobin of 10.7 and hematocrit 33.9.  Iron study was normal with serum iron of 97 and iron saturation of 42%.  The patient has normal vitamin B12 as well as serum folate.  Other studies including hemoglobin electrophoresis as well as blood for heavy metal, ferritin are still pending. I recommended for the patient to continue on the oral iron supplements for now.  We will call her if there is any concerning abnormalities on the pending blood work. She was also advised to follow-up with her gastroenterology for consideration of repeat colonoscopy and evaluation of her Crohn's disease. We will see the patient back for follow-up visit in 3 months for evaluation and repeat blood work. She was advised to call immediately if she has any other concerning symptoms in the interval. The total time spent in the appointment was 60 minutes. Disclaimer: This note was dictated with voice recognition software. Similar sounding words can inadvertently be transcribed and may be missed upon review. Lajuana Matte, MD

## 2022-11-05 ENCOUNTER — Inpatient Hospital Stay: Payer: Medicare PPO | Attending: Physician Assistant | Admitting: Physician Assistant

## 2022-11-05 ENCOUNTER — Other Ambulatory Visit: Payer: Self-pay

## 2022-11-05 ENCOUNTER — Inpatient Hospital Stay: Payer: Medicare PPO

## 2022-11-05 VITALS — BP 150/77 | HR 74 | Temp 98.0°F | Resp 15 | Wt 222.7 lb

## 2022-11-05 DIAGNOSIS — Z7952 Long term (current) use of systemic steroids: Secondary | ICD-10-CM | POA: Insufficient documentation

## 2022-11-05 DIAGNOSIS — D509 Iron deficiency anemia, unspecified: Secondary | ICD-10-CM | POA: Diagnosis not present

## 2022-11-05 DIAGNOSIS — Z79631 Long term (current) use of antimetabolite agent: Secondary | ICD-10-CM | POA: Diagnosis not present

## 2022-11-05 DIAGNOSIS — D649 Anemia, unspecified: Secondary | ICD-10-CM

## 2022-11-05 DIAGNOSIS — Z79624 Long term (current) use of inhibitors of nucleotide synthesis: Secondary | ICD-10-CM | POA: Diagnosis not present

## 2022-11-05 DIAGNOSIS — K5090 Crohn's disease, unspecified, without complications: Secondary | ICD-10-CM | POA: Diagnosis not present

## 2022-11-05 DIAGNOSIS — K5080 Crohn's disease of both small and large intestine without complications: Secondary | ICD-10-CM

## 2022-11-05 LAB — CMP (CANCER CENTER ONLY)
ALT: 7 U/L (ref 0–44)
AST: 15 U/L (ref 15–41)
Albumin: 3.6 g/dL (ref 3.5–5.0)
Alkaline Phosphatase: 78 U/L (ref 38–126)
Anion gap: 6 (ref 5–15)
BUN: 20 mg/dL (ref 8–23)
CO2: 30 mmol/L (ref 22–32)
Calcium: 9.4 mg/dL (ref 8.9–10.3)
Chloride: 102 mmol/L (ref 98–111)
Creatinine: 0.94 mg/dL (ref 0.44–1.00)
GFR, Estimated: >60 mL/min (ref >60)
Glucose, Bld: 144 mg/dL — ABNORMAL HIGH (ref 70–99)
Potassium: 3.5 mmol/L (ref 3.5–5.1)
Sodium: 138 mmol/L (ref 135–145)
Total Bilirubin: 0.4 mg/dL (ref 0.3–1.2)
Total Protein: 7.9 g/dL (ref 6.5–8.1)

## 2022-11-05 LAB — CBC WITH DIFFERENTIAL (CANCER CENTER ONLY)
Abs Immature Granulocytes: 0.02 10*3/uL (ref 0.00–0.07)
Basophils Absolute: 0 10*3/uL (ref 0.0–0.1)
Basophils Relative: 1 %
Eosinophils Absolute: 0 10*3/uL (ref 0.0–0.5)
Eosinophils Relative: 1 %
HCT: 33.9 % — ABNORMAL LOW (ref 36.0–46.0)
Hemoglobin: 10.7 g/dL — ABNORMAL LOW (ref 12.0–15.0)
Immature Granulocytes: 0 %
Lymphocytes Relative: 20 %
Lymphs Abs: 1.2 10*3/uL (ref 0.7–4.0)
MCH: 23.3 pg — ABNORMAL LOW (ref 26.0–34.0)
MCHC: 31.6 g/dL (ref 30.0–36.0)
MCV: 73.9 fL — ABNORMAL LOW (ref 80.0–100.0)
Monocytes Absolute: 0.4 10*3/uL (ref 0.1–1.0)
Monocytes Relative: 7 %
Neutro Abs: 4.6 10*3/uL (ref 1.7–7.7)
Neutrophils Relative %: 71 %
Platelet Count: 286 10*3/uL (ref 150–400)
RBC: 4.59 MIL/uL (ref 3.87–5.11)
RDW: 17.1 % — ABNORMAL HIGH (ref 11.5–15.5)
WBC Count: 6.3 10*3/uL (ref 4.0–10.5)
nRBC: 0 % (ref 0.0–0.2)

## 2022-11-05 LAB — FOLATE: Folate: 21.1 ng/mL (ref >5.9)

## 2022-11-05 LAB — IRON AND IRON BINDING CAPACITY (CC-WL,HP ONLY)
Iron: 97 ug/dL (ref 28–170)
Saturation Ratios: 42 % — ABNORMAL HIGH (ref 10.4–31.8)
TIBC: 232 ug/dL — ABNORMAL LOW (ref 250–450)
UIBC: 135 ug/dL — ABNORMAL LOW (ref 148–442)

## 2022-11-05 LAB — VITAMIN B12: Vitamin B-12: 254 pg/mL (ref 180–914)

## 2022-11-05 LAB — FERRITIN: Ferritin: 41 ng/mL (ref 11–307)

## 2022-11-06 LAB — HEAVY METALS, BLOOD
Arsenic: 2 ug/L (ref 0–9)
Lead: <1 ug/dL (ref 0.0–3.4)
Mercury: <1 ug/L (ref 0.0–14.9)

## 2022-11-07 ENCOUNTER — Encounter: Payer: Medicare PPO | Admitting: Rehabilitative and Restorative Service Providers"

## 2022-11-07 LAB — HGB FRACTIONATION CASCADE
Hgb A2: 2.3 % (ref 1.8–3.2)
Hgb A: 97.7 % (ref 96.4–98.8)
Hgb F: 0 % (ref 0.0–2.0)
Hgb S: 0 %

## 2022-11-08 ENCOUNTER — Telehealth: Payer: Self-pay | Admitting: Physician Assistant

## 2022-11-08 NOTE — Telephone Encounter (Signed)
I called her to review her lab results. No IV iron infusion needed. She also was instructed to take multivitamin with B12 which is normal but on lower side of normal. We will see her back in 3 months for repeat blood work.

## 2022-11-12 ENCOUNTER — Encounter: Payer: Medicare PPO | Admitting: Rehabilitative and Restorative Service Providers"

## 2022-11-14 ENCOUNTER — Encounter: Payer: Medicare PPO | Admitting: Rehabilitative and Restorative Service Providers"

## 2022-11-19 ENCOUNTER — Encounter: Payer: Medicare PPO | Admitting: Rehabilitative and Restorative Service Providers"

## 2022-11-21 ENCOUNTER — Encounter: Payer: Medicare PPO | Admitting: Rehabilitative and Restorative Service Providers"

## 2022-11-22 ENCOUNTER — Other Ambulatory Visit: Payer: Self-pay | Admitting: Family

## 2022-11-26 ENCOUNTER — Encounter: Payer: Medicare PPO | Admitting: Rehabilitative and Restorative Service Providers"

## 2022-11-28 DIAGNOSIS — K5080 Crohn's disease of both small and large intestine without complications: Secondary | ICD-10-CM | POA: Diagnosis not present

## 2022-12-20 ENCOUNTER — Telehealth: Payer: Self-pay | Admitting: Internal Medicine

## 2022-12-20 NOTE — Telephone Encounter (Signed)
Called patient regarding upcoming November appointments, left a voicemail.  

## 2022-12-25 ENCOUNTER — Other Ambulatory Visit: Payer: Self-pay | Admitting: Family

## 2022-12-30 DIAGNOSIS — K5080 Crohn's disease of both small and large intestine without complications: Secondary | ICD-10-CM | POA: Diagnosis not present

## 2023-01-16 DIAGNOSIS — I10 Essential (primary) hypertension: Secondary | ICD-10-CM | POA: Diagnosis not present

## 2023-01-16 DIAGNOSIS — F331 Major depressive disorder, recurrent, moderate: Secondary | ICD-10-CM | POA: Diagnosis not present

## 2023-01-16 DIAGNOSIS — F411 Generalized anxiety disorder: Secondary | ICD-10-CM | POA: Diagnosis not present

## 2023-01-24 ENCOUNTER — Other Ambulatory Visit: Payer: Self-pay | Admitting: Family

## 2023-01-28 DIAGNOSIS — K5080 Crohn's disease of both small and large intestine without complications: Secondary | ICD-10-CM | POA: Diagnosis not present

## 2023-02-04 ENCOUNTER — Other Ambulatory Visit: Payer: Medicare PPO

## 2023-02-04 ENCOUNTER — Ambulatory Visit: Payer: Medicare PPO | Admitting: Physician Assistant

## 2023-02-07 IMAGING — MG MM DIGITAL SCREENING BILAT W/ TOMO AND CAD
8 series · 8 of 24 positions shown · non-contrast
Comparison: Previous exam(s).

CLINICAL DATA: Screening.

EXAM:
DIGITAL SCREENING BILATERAL MAMMOGRAM WITH TOMOSYNTHESIS AND CAD
TECHNIQUE: Bilateral screening digital craniocaudal and mediolateral oblique
mammograms were obtained. Bilateral screening digital breast
tomosynthesis was performed. The images were evaluated with
computer-aided detection.

[L CC synth-2D]
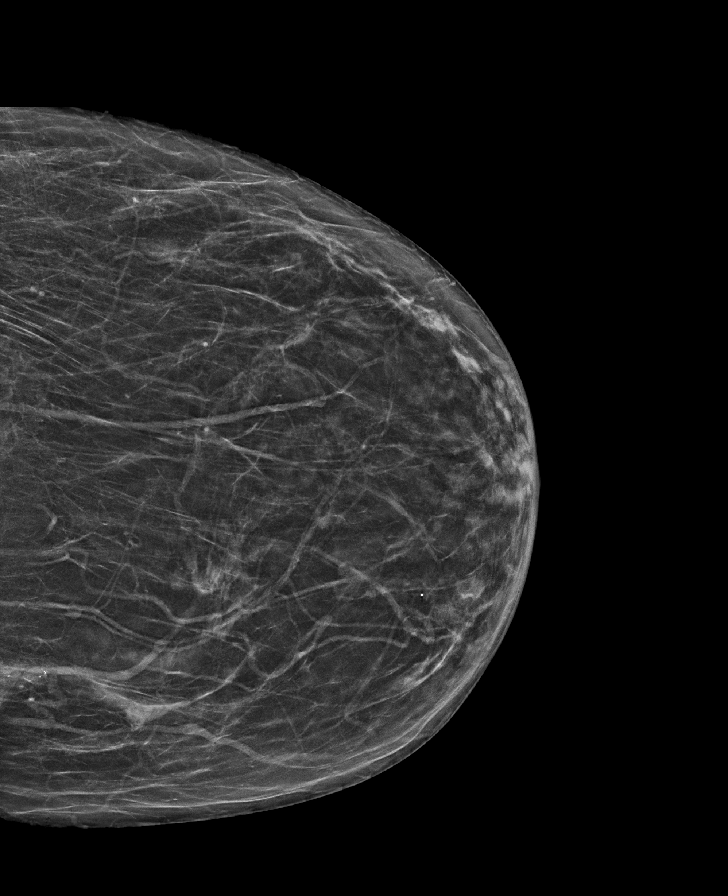

[R MLO synth-2D]
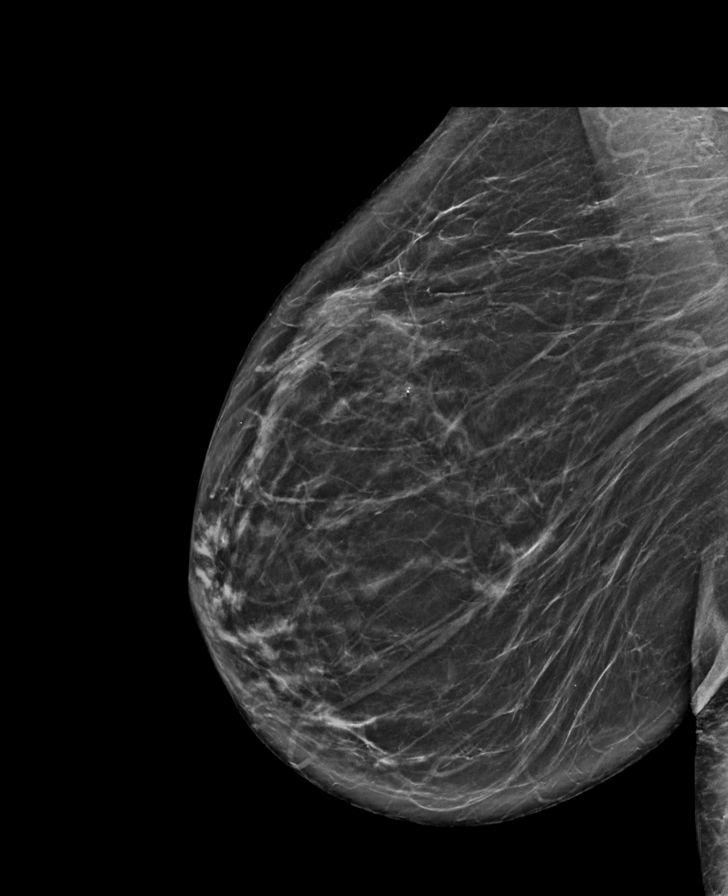

[L MLO synth-2D]
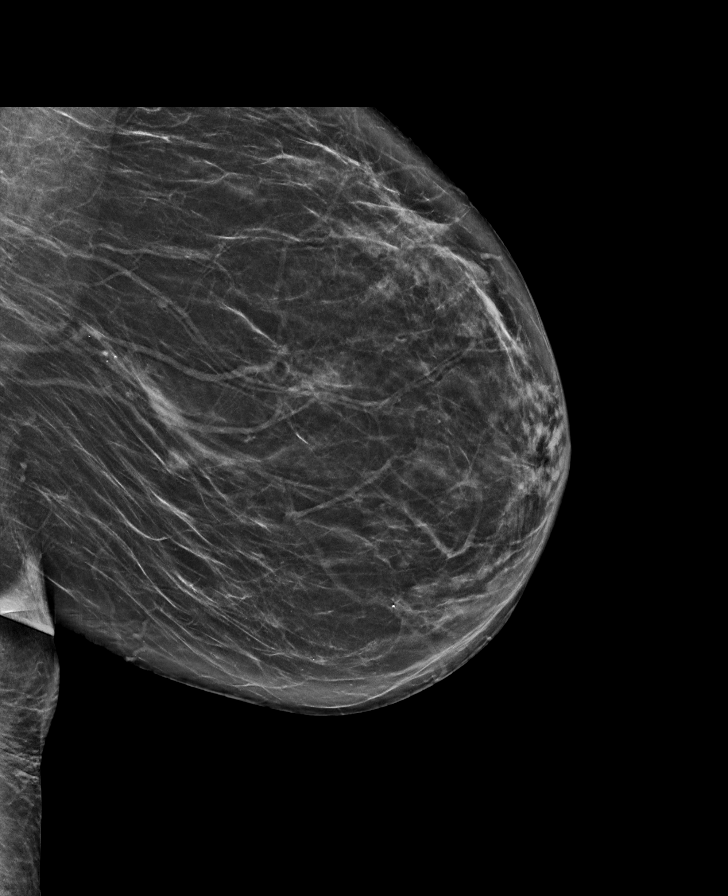

[R CC synth-2D]
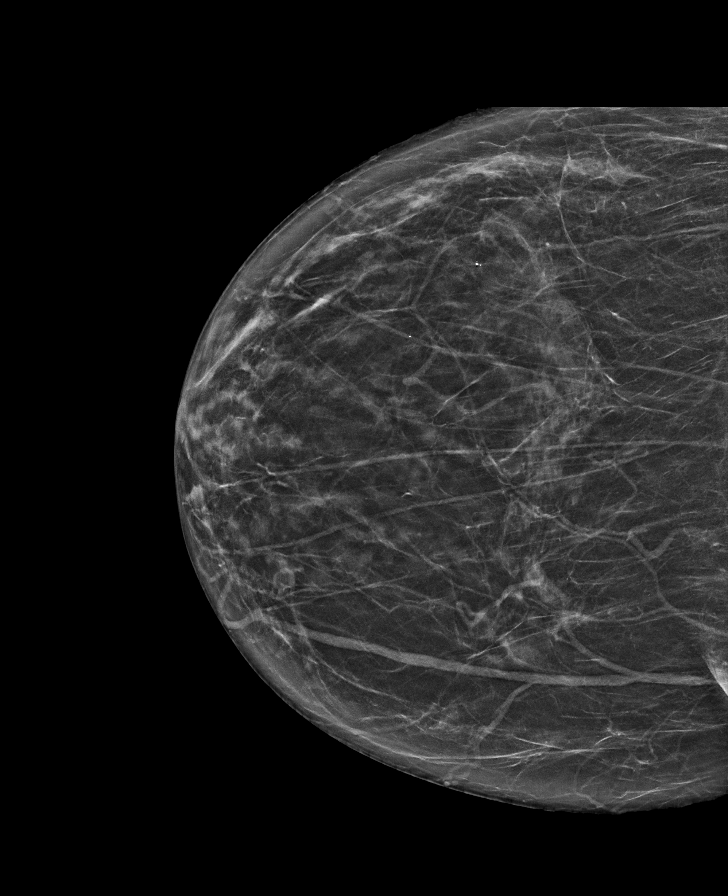

[R MLO tomo · tomo slice 35/70.0]
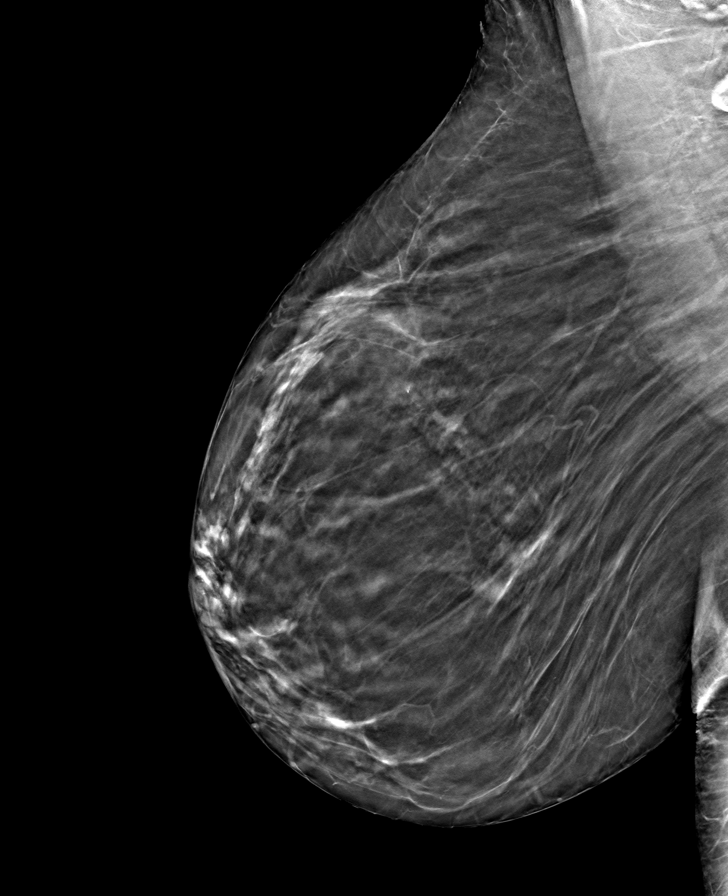

[R CC tomo · tomo slice 31/61.0]
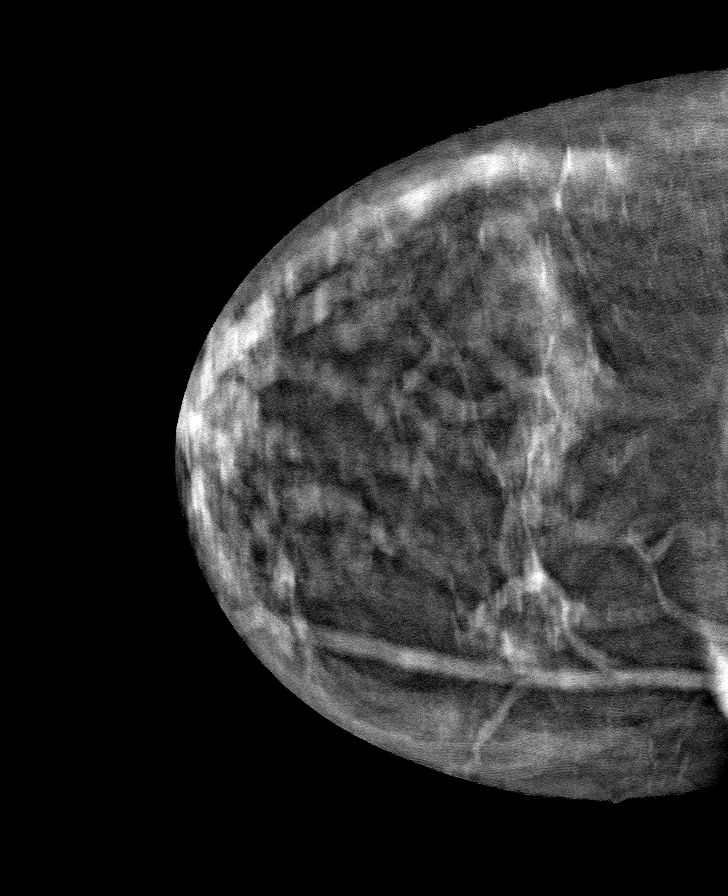

[L CC tomo · tomo slice 33/64.0]
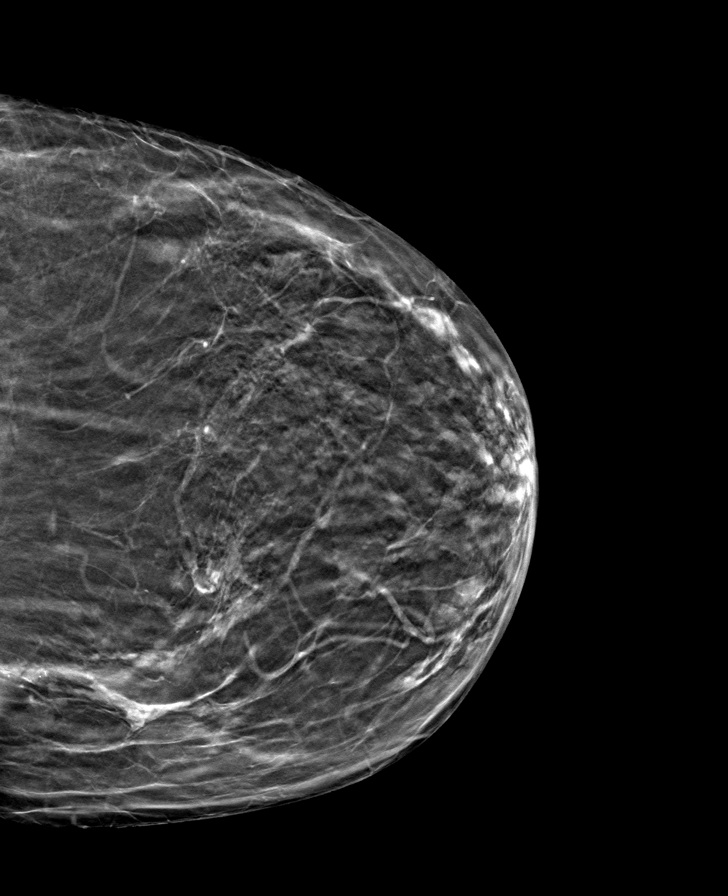

[L MLO tomo · tomo slice 35/69.0]
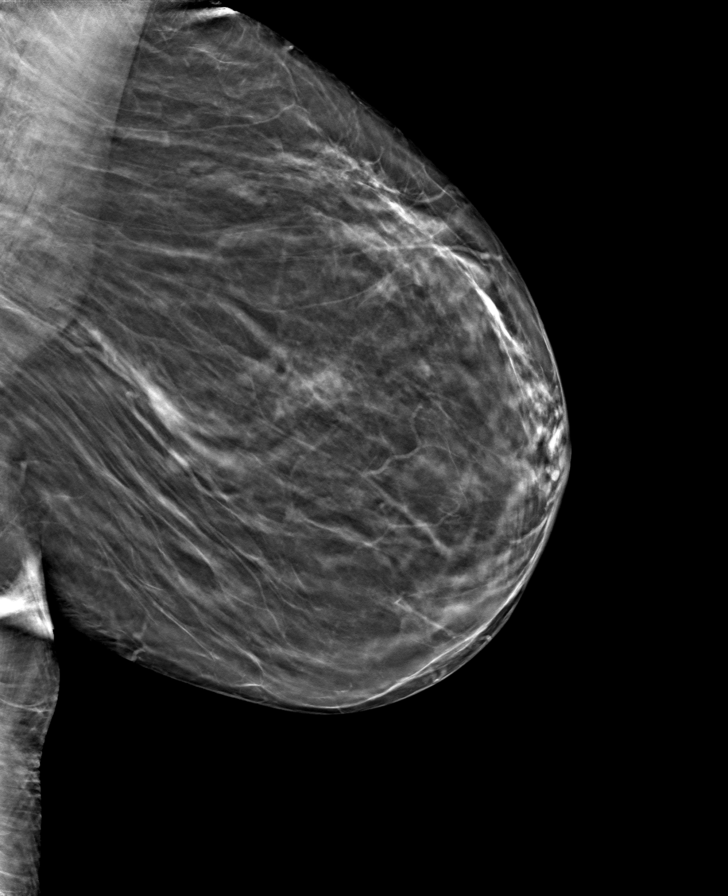

[8 of 24 positions shown; findings below may reference images not displayed]

ACR Breast Density Category c: The breast tissue is heterogeneously
dense, which may obscure small masses.
FINDINGS: There are no findings suspicious for malignancy.
IMPRESSION: No mammographic evidence of malignancy. A result letter of this
screening mammogram will be mailed directly to the patient.

RECOMMENDATION:
Screening mammogram in one year. (Code:Q3-W-BC3)

BI-RADS CATEGORY  1: Negative.

## 2023-02-20 ENCOUNTER — Telehealth: Payer: Self-pay | Admitting: Family

## 2023-02-20 ENCOUNTER — Other Ambulatory Visit: Payer: Self-pay | Admitting: Family

## 2023-02-20 NOTE — Telephone Encounter (Signed)
Maria Hampton you last saw this pt in the office 09/2022 please see the message below.

## 2023-02-20 NOTE — Telephone Encounter (Signed)
Pt called requesting a refill of tramadol. Pt is asking for an increase. Pt gets 30 tablet at 2 daily which is 15 day supply. Pt is going out of town and 60 or 90 day supply. Please call pt about this matter at [phone].

## 2023-02-28 MED ORDER — TRAMADOL HCL 50 MG PO TABS: 50.0000 mg | ORAL_TABLET | Freq: Three times a day (TID) | ORAL | 0 refills | Status: DC

## 2023-03-24 DIAGNOSIS — K5080 Crohn's disease of both small and large intestine without complications: Secondary | ICD-10-CM | POA: Diagnosis not present

## 2023-03-26 ENCOUNTER — Other Ambulatory Visit: Payer: Self-pay | Admitting: Family Medicine

## 2023-03-26 DIAGNOSIS — Z1231 Encounter for screening mammogram for malignant neoplasm of breast: Secondary | ICD-10-CM

## 2023-04-01 DIAGNOSIS — L732 Hidradenitis suppurativa: Secondary | ICD-10-CM | POA: Diagnosis not present

## 2023-04-01 DIAGNOSIS — R7303 Prediabetes: Secondary | ICD-10-CM | POA: Diagnosis not present

## 2023-04-01 DIAGNOSIS — I10 Essential (primary) hypertension: Secondary | ICD-10-CM | POA: Diagnosis not present

## 2023-04-01 DIAGNOSIS — Z6837 Body mass index (BMI) 37.0-37.9, adult: Secondary | ICD-10-CM | POA: Diagnosis not present

## 2023-04-01 DIAGNOSIS — D509 Iron deficiency anemia, unspecified: Secondary | ICD-10-CM | POA: Diagnosis not present

## 2023-04-01 DIAGNOSIS — F331 Major depressive disorder, recurrent, moderate: Secondary | ICD-10-CM | POA: Diagnosis not present

## 2023-04-01 DIAGNOSIS — E782 Mixed hyperlipidemia: Secondary | ICD-10-CM | POA: Diagnosis not present

## 2023-04-01 DIAGNOSIS — K5080 Crohn's disease of both small and large intestine without complications: Secondary | ICD-10-CM | POA: Diagnosis not present

## 2023-04-04 ENCOUNTER — Other Ambulatory Visit: Payer: Self-pay | Admitting: Family

## 2023-04-15 ENCOUNTER — Ambulatory Visit: Payer: Medicare PPO

## 2023-04-17 DIAGNOSIS — L732 Hidradenitis suppurativa: Secondary | ICD-10-CM | POA: Diagnosis not present

## 2023-04-17 DIAGNOSIS — K5080 Crohn's disease of both small and large intestine without complications: Secondary | ICD-10-CM | POA: Diagnosis not present

## 2023-04-24 DIAGNOSIS — K5080 Crohn's disease of both small and large intestine without complications: Secondary | ICD-10-CM | POA: Diagnosis not present

## 2023-05-02 ENCOUNTER — Ambulatory Visit
Admission: RE | Admit: 2023-05-02 | Discharge: 2023-05-02 | Disposition: A | Discharge status: Home / Self Care | Payer: Medicare PPO | Source: Ambulatory Visit | Attending: Family Medicine | Admitting: Family Medicine

## 2023-05-02 DIAGNOSIS — Z1231 Encounter for screening mammogram for malignant neoplasm of breast: Secondary | ICD-10-CM

## 2023-05-07 ENCOUNTER — Other Ambulatory Visit: Payer: Self-pay | Admitting: Family

## 2023-05-19 DIAGNOSIS — K5080 Crohn's disease of both small and large intestine without complications: Secondary | ICD-10-CM | POA: Diagnosis not present

## 2023-06-17 ENCOUNTER — Other Ambulatory Visit: Payer: Self-pay | Admitting: Family

## 2023-06-18 DIAGNOSIS — K5080 Crohn's disease of both small and large intestine without complications: Secondary | ICD-10-CM | POA: Diagnosis not present

## 2023-07-21 ENCOUNTER — Other Ambulatory Visit: Payer: Self-pay | Admitting: Family

## 2023-07-29 DIAGNOSIS — R7303 Prediabetes: Secondary | ICD-10-CM | POA: Diagnosis not present

## 2023-07-29 DIAGNOSIS — F064 Anxiety disorder due to known physiological condition: Secondary | ICD-10-CM | POA: Diagnosis not present

## 2023-07-29 DIAGNOSIS — E785 Hyperlipidemia, unspecified: Secondary | ICD-10-CM | POA: Diagnosis not present

## 2023-07-29 DIAGNOSIS — F411 Generalized anxiety disorder: Secondary | ICD-10-CM | POA: Diagnosis not present

## 2023-07-29 DIAGNOSIS — I10 Essential (primary) hypertension: Secondary | ICD-10-CM | POA: Diagnosis not present

## 2023-07-30 DIAGNOSIS — K5080 Crohn's disease of both small and large intestine without complications: Secondary | ICD-10-CM | POA: Diagnosis not present

## 2023-08-14 ENCOUNTER — Encounter: Payer: Self-pay | Admitting: Physical Medicine and Rehabilitation

## 2023-08-14 ENCOUNTER — Ambulatory Visit: Admitting: Physical Medicine and Rehabilitation

## 2023-08-14 DIAGNOSIS — G8929 Other chronic pain: Secondary | ICD-10-CM

## 2023-08-14 DIAGNOSIS — M47816 Spondylosis without myelopathy or radiculopathy, lumbar region: Secondary | ICD-10-CM

## 2023-08-14 DIAGNOSIS — M48061 Spinal stenosis, lumbar region without neurogenic claudication: Secondary | ICD-10-CM

## 2023-08-14 DIAGNOSIS — M5442 Lumbago with sciatica, left side: Secondary | ICD-10-CM

## 2023-08-14 DIAGNOSIS — M546 Pain in thoracic spine: Secondary | ICD-10-CM | POA: Diagnosis not present

## 2023-08-14 DIAGNOSIS — M5416 Radiculopathy, lumbar region: Secondary | ICD-10-CM

## 2023-08-14 NOTE — Progress Notes (Signed)
 Pain Scale   Average Pain 0 Patient advising she has lower back pain on occ.        +Driver, -BT, -Dye Allergies.

## 2023-08-14 NOTE — Progress Notes (Signed)
 Maria Hampton - 78 y.o. female MRN [id number]  Date of birth: 07-14-1945  Office Visit Note: Visit Date: 08/14/2023 PCP: Jonathon Neighbors, MD Referred by: Jonathon Neighbors, MD  Subjective: Chief Complaint  Patient presents with   Lower Back - Pain   HPI: Maria Hampton is a 78 y.o. female who comes in today for evaluation of chronic, worsening and severe bilateral lower back pain, left leg weakness/pain and bilateral thoracic back pain. Pain ongoing for over a year. Her lower back and left leg is biggest pain generator, worsens with movement and activity. 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 She describes pain as sore and aching sensation, currently rates as 5 out of 10. States her pain increases throughout the day with activity. Some relief of pain with home exercise regimen, rest and use of medications. She is prescribed Tramadol  by Felicity Horseman, NP, feels this medication significantly helps with her pain. She did attend formal evaluation with physical therapy last year, however did not return due to cost. Lumbar MRI imaging from [facility] in 2024 shows severe degenerative disc disease at L4-L5. There is also moderate right sided foraminal stenosis and facet arthropathy at this level. Moderate degenerative disc disease, moderate left sided foraminal stenosis, moderate facet arthropathy at L5-S1. No high grade spinal canal stenosis noted. No history of lumbar surgery/injections. Patient denies focal weakness, numbness and tingling.   Chronic bilateral thoracic back pain usually occurs in the morning after waking up and tends to resolve with activity.      Review of Systems  Musculoskeletal:  Positive for back pain and myalgias.  Neurological:  Positive for weakness. Negative for tingling and sensory change.  All other systems reviewed and are negative.  Otherwise per HPI.  Assessment & Plan: Visit Diagnoses:    ICD-10-CM   1.  Chronic bilateral low back pain with left-sided sciatica  G89.29 Ambulatory referral to Physical Medicine Rehab   M54.42     2. Lumbar radiculopathy  M54.16 Ambulatory referral to Physical Medicine Rehab    3. Foraminal stenosis of lumbar region  M48.061 Ambulatory referral to Physical Medicine Rehab    4. Facet arthropathy, lumbar  M47.816 Ambulatory referral to Physical Medicine Rehab    5. Chronic bilateral thoracic back pain  M54.6 Ambulatory referral to Physical Medicine Rehab   G89.29        Plan: Findings:  Chronic, worsening and severe bilateral lower back pain, left leg weakness/pain and bilateral thoracic back pain. Lower back and left leg are biggest pain generator. She continues to have pain despite good conservative therapies such as formal physical therapy, home exercise regimen, rest and use of medications. Lumbar MRI imaging from 2024 shows degenerative changes, facet arthropathy and foraminal stenosis at L4-L5 and L5-S1. We discussed treatment in plan in detail today. Next step is to perform diagnostic and hopefully therapeutic left L5-S1 interlaminar epidural steroid injection under fluoroscopic guidance. She is not currently taking anticoagulant medications. I discussed injection procedure with patient in detail today, she has no questions at this time. I would like her to continue with home exercise regimen, could look at re-grouping with physical therapy in the future. I do think PT would help to address chronic thoracic back pain/myofascial pain. Would consider facet injections if she presents with more axial back pain. No red flag symptoms noted upon exam today.     Meds & Orders: No orders of the defined types were placed in this encounter.  Orders Placed This Encounter  Procedures   Ambulatory referral to Physical Medicine Rehab    Follow-up: Return for Left L5-S1 interlaminar epidural steroid injection.   Procedures: No procedures performed      Clinical  History: Lumbar MRI Imaging 08/21/2022 from [facility] in [place].   She reports that she has never smoked. She has never used smokeless tobacco. No results for input(s): "HGBA1C", "LABURIC" in the last 8760 hours.  Objective:  VS:  HT:    WT:   BMI:     BP:   HR: bpm  TEMP: ( )  RESP:  Physical Exam Vitals and nursing note reviewed.  HENT:     Head: Normocephalic and atraumatic.     Right Ear: External ear normal.     Left Ear: External ear normal.     Nose: Nose normal.     Mouth/Throat:     Mouth: Mucous membranes are moist.  Eyes:     Extraocular Movements: Extraocular movements intact.  Cardiovascular:     Rate and Rhythm: Normal rate.     Pulses: Normal pulses.  Pulmonary:     Effort: Pulmonary effort is normal.  Abdominal:     General: Abdomen is flat. There is no distension.  Musculoskeletal:        General: Tenderness present.     Cervical back: Normal range of motion.     Comments: Patient is slow to rise from seated position to standing. Mild pain noted with facet loading and lumbar extension. 5/5 strength noted with bilateral hip flexion, knee flexion/extension, ankle dorsiflexion/plantarflexion and EHL. No clonus noted bilaterally. No pain upon palpation of greater trochanters. No pain with internal/external rotation of bilateral hips. Sensation intact bilaterally. Myofascial tenderness noted to bilateral thoracic back upon palpation. Negative slump test bilaterally. Ambulates without aid, gait steady.     Skin:    General: Skin is warm and dry.     Capillary Refill: Capillary refill takes less than 2 seconds.  Neurological:     General: No focal deficit present.     Mental Status: She is alert and oriented to person, place, and time.  Psychiatric:        Mood and Affect: Mood normal.        Behavior: Behavior normal.     Ortho Exam  Imaging: No results found.  Past Medical/Family/Surgical/Social History: Medications & Allergies reviewed per EMR,  new medications updated. Patient Active Problem List   Diagnosis Date Noted   Anemia 11/05/2022   Past Medical History:  Diagnosis Date   Anemia    Vitamin D deficiency    Family History  Problem Relation Age of Onset   Hypertension Mother    Hypertension Father    Seizures Father    Past Surgical History:  Procedure Laterality Date   bowel reconnection     x2   BREAST BIOPSY Left 04/15/2022   MM LT BREAST BX W LOC DEV 1ST LESION IMAGE BX SPEC STEREO GUIDE 04/15/2022 [facility]   TUBAL LIGATION     Social History   Occupational History   Not on file  Tobacco Use   Smoking status: Never   Smokeless tobacco: Never  Substance and Sexual Activity   Alcohol use: Yes    Alcohol/week: 2.0 standard drinks of alcohol    Types: 1 Glasses of wine, 1 Cans of beer per week   Drug use: No   Sexual activity: Yes    Partners: Male    Birth  control/protection: None

## 2023-08-28 ENCOUNTER — Other Ambulatory Visit: Payer: Self-pay | Admitting: Family

## 2023-09-01 DIAGNOSIS — K5080 Crohn's disease of both small and large intestine without complications: Secondary | ICD-10-CM | POA: Diagnosis not present

## 2023-09-10 ENCOUNTER — Other Ambulatory Visit: Payer: Self-pay

## 2023-09-10 ENCOUNTER — Ambulatory Visit: Admitting: Physical Medicine and Rehabilitation

## 2023-09-10 VITALS — BP 115/72 | HR 76

## 2023-09-10 DIAGNOSIS — M5416 Radiculopathy, lumbar region: Secondary | ICD-10-CM

## 2023-09-10 MED ORDER — METHYLPREDNISOLONE ACETATE 40 MG/ML IJ SUSP
40.0000 mg | Freq: Once | INTRAMUSCULAR | Status: AC
  Administered 2023-09-10: 40 mg

## 2023-09-10 NOTE — Progress Notes (Signed)
 Maria Hampton - 78 y.o. female MRN [id number]  Date of birth: 05/31/1945  Office Visit Note: Visit Date: 09/10/2023 PCP: Benjamine Aland, MD Referred by: Benjamine Aland, MD  Subjective: Chief Complaint  Patient presents with   Lower Back - Pain   HPI:  Maria Hampton is a 78 y.o. female who comes in today at the request of Duwaine Pouch, FNP for planned Left L5-S1 Lumbar Interlaminar epidural steroid injection with fluoroscopic guidance.  The patient has failed conservative care including home exercise, medications, time and activity modification.  This injection will be diagnostic and hopefully therapeutic.  Please see requesting physician notes for further details and justification.   ROS Otherwise per HPI.  Assessment & Plan: Visit Diagnoses:    ICD-10-CM   1. Lumbar radiculopathy  M54.16 XR C-ARM NO REPORT    Epidural Steroid injection    methylPREDNISolone  acetate (DEPO-MEDROL ) injection 40 mg      Plan: No additional findings.   Meds & Orders:  Meds ordered this encounter  Medications   methylPREDNISolone  acetate (DEPO-MEDROL ) injection 40 mg    Orders Placed This Encounter  Procedures   XR C-ARM NO REPORT   Epidural Steroid injection    Follow-up: Return for visit to requesting provider as needed.   Procedures: No procedures performed  Lumbar Epidural Steroid Injection - Interlaminar Approach with Fluoroscopic Guidance  Patient: Maria Hampton      Date of Birth: Sep 18, 1945 MRN: [id number] PCP: Benjamine Aland, MD      Visit Date: 09/10/2023   Universal Protocol:     Consent Given By: the patient  Position: PRONE  Additional Comments: Vital signs were monitored before and after the procedure. Patient was prepped and draped in the usual sterile fashion. The correct patient, procedure, and site was verified.   Injection Procedure Details:   Procedure diagnoses: Lumbar radiculopathy [M54.16]   Meds Administered:  Meds ordered  this encounter  Medications   methylPREDNISolone  acetate (DEPO-MEDROL ) injection 40 mg     Laterality: Left  Location/Site:  L5-S1  Needle: 4.5 in., 20 ga. Tuohy  Needle Placement: Paramedian epidural  Findings:   -Comments: Excellent flow of contrast into the epidural space.  Procedure Details: Using a paramedian approach from the side mentioned above, the region overlying the inferior lamina was localized under fluoroscopic visualization and the soft tissues overlying this structure were infiltrated with 4 ml. of 1% Lidocaine without Epinephrine. The Tuohy needle was inserted into the epidural space using a paramedian approach.   The epidural space was localized using loss of resistance along with counter oblique bi-planar fluoroscopic views.  After negative aspirate for air, blood, and CSF, a 2 ml. volume of Isovue -250 was injected into the epidural space and the flow of contrast was observed. Radiographs were obtained for documentation purposes.    The injectate was administered into the level noted above.   Additional Comments:  The patient tolerated the procedure well Dressing: 2 x 2 sterile gauze and Band-Aid    Post-procedure details: Patient was observed during the procedure. Post-procedure instructions were reviewed.  Patient left the clinic in stable condition.   Clinical History: Lumbar MRI Imaging 08/21/2022 from [facility] in [place].     Objective:  VS:  HT:    WT:   BMI:     BP:115/72  HR:76bpm  TEMP: ( )  RESP:  Physical Exam Vitals and nursing note reviewed.  Constitutional:      General: She is not in acute distress.  Appearance: Normal appearance. She is obese. She is not ill-appearing.  HENT:     Head: Normocephalic and atraumatic.     Right Ear: External ear normal.     Left Ear: External ear normal.  Eyes:     Extraocular Movements: Extraocular movements intact.  Cardiovascular:     Rate and Rhythm: Normal rate.     Pulses:  Normal pulses.  Pulmonary:     Effort: Pulmonary effort is normal. No respiratory distress.  Abdominal:     General: There is no distension.     Palpations: Abdomen is soft.  Musculoskeletal:        General: Tenderness present.     Cervical back: Neck supple.     Right lower leg: No edema.     Left lower leg: No edema.     Comments: Patient has good distal strength with no pain over the greater trochanters.  No clonus or focal weakness.  Skin:    Findings: No erythema, lesion or rash.  Neurological:     General: No focal deficit present.     Mental Status: She is alert and oriented to person, place, and time.     Sensory: No sensory deficit.     Motor: No weakness or abnormal muscle tone.     Coordination: Coordination normal.  Psychiatric:        Mood and Affect: Mood normal.        Behavior: Behavior normal.      Imaging: No results found.

## 2023-09-10 NOTE — Progress Notes (Signed)
 Pain Scale   Average Pain 6 Patient advising she has lower back pain that seems to increase when doing normal daily activities. Patient states her pain decreases when she is resting.        +Driver, -BT, -Dye Allergies.

## 2023-09-10 NOTE — Patient Instructions (Signed)

## 2023-09-15 ENCOUNTER — Encounter: Admitting: Physical Medicine and Rehabilitation

## 2023-09-22 NOTE — Procedures (Signed)
 Lumbar Epidural Steroid Injection - Interlaminar Approach with Fluoroscopic Guidance  Patient: Maria Hampton      Date of Birth: [birth year] MRN: [id number] PCP: Benjamine Aland, MD      Visit Date: 09/10/2023   Universal Protocol:     Consent Given By: the patient  Position: PRONE  Additional Comments: Vital signs were monitored before and after the procedure. Patient was prepped and draped in the usual sterile fashion. The correct patient, procedure, and site was verified.   Injection Procedure Details:   Procedure diagnoses: Lumbar radiculopathy [M54.16]   Meds Administered:  Meds ordered this encounter  Medications   methylPREDNISolone  acetate (DEPO-MEDROL ) injection 40 mg     Laterality: Left  Location/Site:  L5-S1  Needle: 4.5 in., 20 ga. Tuohy  Needle Placement: Paramedian epidural  Findings:   -Comments: Excellent flow of contrast into the epidural space.  Procedure Details: Using a paramedian approach from the side mentioned above, the region overlying the inferior lamina was localized under fluoroscopic visualization and the soft tissues overlying this structure were infiltrated with 4 ml. of 1% Lidocaine without Epinephrine. The Tuohy needle was inserted into the epidural space using a paramedian approach.   The epidural space was localized using loss of resistance along with counter oblique bi-planar fluoroscopic views.  After negative aspirate for air, blood, and CSF, a 2 ml. volume of Isovue -250 was injected into the epidural space and the flow of contrast was observed. Radiographs were obtained for documentation purposes.    The injectate was administered into the level noted above.   Additional Comments:  The patient tolerated the procedure well Dressing: 2 x 2 sterile gauze and Band-Aid    Post-procedure details: Patient was observed during the procedure. Post-procedure instructions were reviewed.  Patient left the clinic in stable  condition.

## 2023-09-29 DIAGNOSIS — K5080 Crohn's disease of both small and large intestine without complications: Secondary | ICD-10-CM | POA: Diagnosis not present

## 2023-10-02 ENCOUNTER — Other Ambulatory Visit: Payer: Self-pay | Admitting: Family

## 2023-10-10 ENCOUNTER — Other Ambulatory Visit: Payer: Self-pay | Admitting: Physical Medicine and Rehabilitation

## 2023-10-10 ENCOUNTER — Telehealth: Payer: Self-pay | Admitting: Physical Medicine and Rehabilitation

## 2023-10-10 DIAGNOSIS — M5416 Radiculopathy, lumbar region: Secondary | ICD-10-CM

## 2023-10-10 NOTE — Telephone Encounter (Signed)
 Patient called. Says her back is starting to hurt again. Would like a call back. Does she need to come in? [phone]

## 2023-10-13 ENCOUNTER — Telehealth: Payer: Self-pay

## 2023-10-13 ENCOUNTER — Other Ambulatory Visit: Payer: Self-pay | Admitting: Physical Medicine and Rehabilitation

## 2023-10-13 MED ORDER — DIAZEPAM 5 MG PO TABS: ORAL_TABLET | ORAL | 0 refills | Status: DC

## 2023-10-13 NOTE — Telephone Encounter (Signed)
 Patient requesting Pre procedure medication

## 2023-10-27 DIAGNOSIS — K5080 Crohn's disease of both small and large intestine without complications: Secondary | ICD-10-CM | POA: Diagnosis not present

## 2023-10-27 NOTE — Patient Instructions (Signed)
 If you have any of the below symptoms after your infusion appointment please seek medical attention at your closest urgent care or emergency department: Itching Rash  Facial swelling Redness at IV insertion site  Nausea/Vomiting Shortness of breath/ Difficulty breathing Chest pain  Obstetric (OB) Patients- If you have any concerns with fetal movement, or any of the above symptoms after your infusion please go to [facility] triage for further medical care.    If you have any concerns about continuing your infusion treatment please contact your referring providers office.    To cancel or reschedule your next infusion appointment please contact your infusion clinic: [facility] [phone]

## 2023-10-28 ENCOUNTER — Other Ambulatory Visit: Payer: Self-pay | Admitting: Orthopedic Surgery

## 2023-10-29 ENCOUNTER — Other Ambulatory Visit: Payer: Self-pay | Admitting: Physician Assistant

## 2023-10-29 DIAGNOSIS — M47816 Spondylosis without myelopathy or radiculopathy, lumbar region: Secondary | ICD-10-CM

## 2023-10-29 MED ORDER — TRAMADOL HCL 50 MG PO TABS: 50.0000 mg | ORAL_TABLET | Freq: Four times a day (QID) | ORAL | 0 refills | Status: AC | PRN

## 2023-11-06 ENCOUNTER — Other Ambulatory Visit: Payer: Self-pay

## 2023-11-06 ENCOUNTER — Ambulatory Visit: Admitting: Physical Medicine and Rehabilitation

## 2023-11-06 VITALS — BP 123/82 | HR 73

## 2023-11-06 DIAGNOSIS — M5416 Radiculopathy, lumbar region: Secondary | ICD-10-CM | POA: Diagnosis not present

## 2023-11-06 MED ORDER — METHYLPREDNISOLONE ACETATE 40 MG/ML IJ SUSP
40.0000 mg | Freq: Once | INTRAMUSCULAR | Status: AC
  Administered 2023-11-06: 40 mg

## 2023-11-06 NOTE — Procedures (Signed)
 Lumbar Epidural Steroid Injection - Interlaminar Approach with Fluoroscopic Guidance  Patient: Maria Hampton      Date of Birth: 1945/04/10 MRN: [id number] PCP: Benjamine Aland, MD      Visit Date: 11/06/2023   Universal Protocol:     Consent Given By: the patient  Position: PRONE  Additional Comments: Vital signs were monitored before and after the procedure. Patient was prepped and draped in the usual sterile fashion. The correct patient, procedure, and site was verified.   Injection Procedure Details:   Procedure diagnoses: Lumbar radiculopathy [M54.16]   Meds Administered:  Meds ordered this encounter  Medications   methylPREDNISolone  acetate (DEPO-MEDROL ) injection 40 mg     Laterality: Left  Location/Site:  L5-S1  Needle: 3.5 in., 20 ga. Tuohy  Needle Placement: Paramedian epidural  Findings:   -Comments: Excellent flow of contrast into the epidural space.  Procedure Details: Using a paramedian approach from the side mentioned above, the region overlying the inferior lamina was localized under fluoroscopic visualization and the soft tissues overlying this structure were infiltrated with 4 ml. of 1% Lidocaine without Epinephrine. The Tuohy needle was inserted into the epidural space using a paramedian approach.   The epidural space was localized using loss of resistance along with counter oblique bi-planar fluoroscopic views.  After negative aspirate for air, blood, and CSF, a 2 ml. volume of Isovue -250 was injected into the epidural space and the flow of contrast was observed. Radiographs were obtained for documentation purposes.    The injectate was administered into the level noted above.   Additional Comments:  The patient tolerated the procedure well Dressing: 2 x 2 sterile gauze and Band-Aid    Post-procedure details: Patient was observed during the procedure. Post-procedure instructions were reviewed.  Patient left the clinic in stable  condition.

## 2023-11-06 NOTE — Progress Notes (Signed)
 Maria Hampton - 78 y.o. female MRN [id number]  Date of birth: December 20, 1945  Office Visit Note: Visit Date: 11/06/2023 PCP: Benjamine Aland, MD Referred by: Benjamine Aland, MD  Subjective: Chief Complaint  Patient presents with   Lower Back - Pain   HPI:  Maria Hampton is a 78 y.o. female who comes in today for planned repeat Left L5-S1  Lumbar Interlaminar epidural steroid injection with fluoroscopic guidance.  The patient has failed conservative care including home exercise, medications, time and activity modification.  This injection will be diagnostic and hopefully therapeutic.  Please see requesting physician notes for further details and justification. Patient received more than 50% pain relief from prior injection.   Referring: Duwaine Pouch, FNP   ROS Otherwise per HPI.  Assessment & Plan: Visit Diagnoses:    ICD-10-CM   1. Lumbar radiculopathy  M54.16 XR C-ARM NO REPORT    Epidural Steroid injection    methylPREDNISolone  acetate (DEPO-MEDROL ) injection 40 mg      Plan: No additional findings.   Meds & Orders:  Meds ordered this encounter  Medications   methylPREDNISolone  acetate (DEPO-MEDROL ) injection 40 mg    Orders Placed This Encounter  Procedures   XR C-ARM NO REPORT   Epidural Steroid injection    Follow-up: Return for visit to requesting provider as needed, consider facets.   Procedures: No procedures performed  Lumbar Epidural Steroid Injection - Interlaminar Approach with Fluoroscopic Guidance  Patient: Maria Hampton      Date of Birth: 04-24-1945 MRN: [id number] PCP: Benjamine Aland, MD      Visit Date: 11/06/2023   Universal Protocol:     Consent Given By: the patient  Position: PRONE  Additional Comments: Vital signs were monitored before and after the procedure. Patient was prepped and draped in the usual sterile fashion. The correct patient, procedure, and site was verified.   Injection Procedure Details:    Procedure diagnoses: Lumbar radiculopathy [M54.16]   Meds Administered:  Meds ordered this encounter  Medications   methylPREDNISolone  acetate (DEPO-MEDROL ) injection 40 mg     Laterality: Left  Location/Site:  L5-S1  Needle: 3.5 in., 20 ga. Tuohy  Needle Placement: Paramedian epidural  Findings:   -Comments: Excellent flow of contrast into the epidural space.  Procedure Details: Using a paramedian approach from the side mentioned above, the region overlying the inferior lamina was localized under fluoroscopic visualization and the soft tissues overlying this structure were infiltrated with 4 ml. of 1% Lidocaine without Epinephrine. The Tuohy needle was inserted into the epidural space using a paramedian approach.   The epidural space was localized using loss of resistance along with counter oblique bi-planar fluoroscopic views.  After negative aspirate for air, blood, and CSF, a 2 ml. volume of Isovue -250 was injected into the epidural space and the flow of contrast was observed. Radiographs were obtained for documentation purposes.    The injectate was administered into the level noted above.   Additional Comments:  The patient tolerated the procedure well Dressing: 2 x 2 sterile gauze and Band-Aid    Post-procedure details: Patient was observed during the procedure. Post-procedure instructions were reviewed.  Patient left the clinic in stable condition.   Clinical History: Lumbar MRI Imaging 08/21/2022 from [facility] in [place]. Right L4 foraminal stenosis. Bilateral L5-S1 facet OA. No Central Stenosis.     Objective:  VS:  HT:    WT:   BMI:     BP:123/82  HR:73bpm  TEMP: ( )  RESP:  Physical Exam Vitals and nursing note reviewed.  Constitutional:      General: She is not in acute distress.    Appearance: Normal appearance. She is not ill-appearing.  HENT:     Head: Normocephalic and atraumatic.     Right Ear: External ear normal.     Left Ear:  External ear normal.  Eyes:     Extraocular Movements: Extraocular movements intact.  Cardiovascular:     Rate and Rhythm: Normal rate.     Pulses: Normal pulses.  Pulmonary:     Effort: Pulmonary effort is normal. No respiratory distress.  Abdominal:     General: There is no distension.     Palpations: Abdomen is soft.  Musculoskeletal:        General: Tenderness present.     Cervical back: Neck supple.     Right lower leg: No edema.     Left lower leg: No edema.     Comments: Patient has good distal strength with no pain over the greater trochanters.  No clonus or focal weakness.  Skin:    Findings: No erythema, lesion or rash.  Neurological:     General: No focal deficit present.     Mental Status: She is alert and oriented to person, place, and time.     Sensory: No sensory deficit.     Motor: No weakness or abnormal muscle tone.     Coordination: Coordination normal.  Psychiatric:        Mood and Affect: Mood normal.        Behavior: Behavior normal.      Imaging: No results found.

## 2023-11-06 NOTE — Progress Notes (Signed)
 Pain Scale   Average Pain 7 Patient advising she has chronic lower back pain that radiates at times to right side. Patient advising that her pain increases when standing and sitting and decrease when she lays down        +Driver, -BT, -Dye Allergies.

## 2023-12-01 DIAGNOSIS — I10 Essential (primary) hypertension: Secondary | ICD-10-CM | POA: Diagnosis not present

## 2023-12-01 DIAGNOSIS — Z6837 Body mass index (BMI) 37.0-37.9, adult: Secondary | ICD-10-CM | POA: Diagnosis not present

## 2023-12-01 DIAGNOSIS — R7303 Prediabetes: Secondary | ICD-10-CM | POA: Diagnosis not present

## 2023-12-01 DIAGNOSIS — F331 Major depressive disorder, recurrent, moderate: Secondary | ICD-10-CM | POA: Diagnosis not present

## 2023-12-01 DIAGNOSIS — R7309 Other abnormal glucose: Secondary | ICD-10-CM | POA: Diagnosis not present

## 2023-12-01 DIAGNOSIS — E782 Mixed hyperlipidemia: Secondary | ICD-10-CM | POA: Diagnosis not present

## 2023-12-08 DIAGNOSIS — K5080 Crohn's disease of both small and large intestine without complications: Secondary | ICD-10-CM | POA: Diagnosis not present

## 2023-12-09 DIAGNOSIS — K5080 Crohn's disease of both small and large intestine without complications: Secondary | ICD-10-CM | POA: Diagnosis not present

## 2023-12-09 DIAGNOSIS — E559 Vitamin D deficiency, unspecified: Secondary | ICD-10-CM | POA: Diagnosis not present

## 2023-12-09 DIAGNOSIS — L732 Hidradenitis suppurativa: Secondary | ICD-10-CM | POA: Diagnosis not present

## 2023-12-11 ENCOUNTER — Other Ambulatory Visit: Payer: Self-pay | Admitting: Physical Medicine and Rehabilitation

## 2023-12-11 DIAGNOSIS — M5416 Radiculopathy, lumbar region: Secondary | ICD-10-CM

## 2023-12-16 ENCOUNTER — Other Ambulatory Visit: Payer: Self-pay | Admitting: Physical Medicine and Rehabilitation

## 2023-12-16 MED ORDER — DIAZEPAM 5 MG PO TABS: ORAL_TABLET | ORAL | 0 refills | Status: AC

## 2023-12-31 ENCOUNTER — Other Ambulatory Visit: Payer: Self-pay | Admitting: Physician Assistant

## 2023-12-31 ENCOUNTER — Ambulatory Visit: Admitting: Physical Medicine and Rehabilitation

## 2023-12-31 ENCOUNTER — Other Ambulatory Visit: Payer: Self-pay

## 2023-12-31 VITALS — BP 131/78 | HR 72

## 2023-12-31 DIAGNOSIS — M5416 Radiculopathy, lumbar region: Secondary | ICD-10-CM

## 2023-12-31 MED ORDER — METHYLPREDNISOLONE ACETATE 40 MG/ML IJ SUSP
40.0000 mg | Freq: Once | INTRAMUSCULAR | Status: AC
  Administered 2023-12-31: 40 mg

## 2023-12-31 NOTE — Progress Notes (Signed)
 Maria Hampton - 78 y.o. female MRN [id number]  Date of birth: November 01, 1945  Office Visit Note: Visit Date: 12/31/2023 PCP: Benjamine Aland, MD Referred by: Benjamine Aland, MD  Subjective: Chief Complaint  Patient presents with   Lower Back - Pain   HPI:  Maria Hampton is a 78 y.o. female who comes in today for planned repeat Left L5-S1  Lumbar Interlaminar epidural steroid injection with fluoroscopic guidance.  The patient has failed conservative care including home exercise, medications, time and activity modification.  This injection will be diagnostic and hopefully therapeutic.  Please see requesting physician notes for further details and justification. Patient received more than 50% pain relief from prior injection.   Referring: Duwaine Pouch, FNP   ROS Otherwise per HPI.  Assessment & Plan: Visit Diagnoses:    ICD-10-CM   1. Lumbar radiculopathy  M54.16 XR C-ARM NO REPORT    Epidural Steroid injection    methylPREDNISolone  acetate (DEPO-MEDROL ) injection 40 mg      Plan: No additional findings.   Meds & Orders:  Meds ordered this encounter  Medications   methylPREDNISolone  acetate (DEPO-MEDROL ) injection 40 mg    Orders Placed This Encounter  Procedures   XR C-ARM NO REPORT   Epidural Steroid injection    Follow-up: Return for visit to requesting provider as needed.   Procedures: No procedures performed  Lumbar Epidural Steroid Injection - Interlaminar Approach with Fluoroscopic Guidance  Patient: Maria Hampton      Date of Birth: December 04, 1945 MRN: [id number] PCP: Benjamine Aland, MD      Visit Date: 12/31/2023   Universal Protocol:     Consent Given By: the patient  Position: PRONE  Additional Comments: Vital signs were monitored before and after the procedure. Patient was prepped and draped in the usual sterile fashion. The correct patient, procedure, and site was verified.   Injection Procedure Details:   Procedure diagnoses:  Lumbar radiculopathy [M54.16]   Meds Administered:  Meds ordered this encounter  Medications   methylPREDNISolone  acetate (DEPO-MEDROL ) injection 40 mg     Laterality: Left  Location/Site:  L5-S1  Needle: 3.5 in., 20 ga. Tuohy  Needle Placement: Paramedian epidural  Findings:   -Comments: Excellent flow of contrast into the epidural space.  Procedure Details: Using a paramedian approach from the side mentioned above, the region overlying the inferior lamina was localized under fluoroscopic visualization and the soft tissues overlying this structure were infiltrated with 4 ml. of 1% Lidocaine without Epinephrine. The Tuohy needle was inserted into the epidural space using a paramedian approach.   The epidural space was localized using loss of resistance along with counter oblique bi-planar fluoroscopic views.  After negative aspirate for air, blood, and CSF, a 2 ml. volume of Isovue -250 was injected into the epidural space and the flow of contrast was observed. Radiographs were obtained for documentation purposes.    The injectate was administered into the level noted above.   Additional Comments:  The patient tolerated the procedure well Dressing: 2 x 2 sterile gauze and Band-Aid    Post-procedure details: Patient was observed during the procedure. Post-procedure instructions were reviewed.  Patient left the clinic in stable condition.   Clinical History: Lumbar MRI Imaging 08/21/2022 from [facility] in [place]. Right L4 foraminal stenosis. Bilateral L5-S1 facet OA. No Central Stenosis.     Objective:  VS:  HT:    WT:   BMI:     BP:131/78  HR:72bpm  TEMP: ( )  RESP:  Physical Exam Vitals and nursing note reviewed.  Constitutional:      General: She is not in acute distress.    Appearance: Normal appearance. She is not ill-appearing.  HENT:     Head: Normocephalic and atraumatic.     Right Ear: External ear normal.     Left Ear: External ear normal.   Eyes:     Extraocular Movements: Extraocular movements intact.  Cardiovascular:     Rate and Rhythm: Normal rate.     Pulses: Normal pulses.  Pulmonary:     Effort: Pulmonary effort is normal. No respiratory distress.  Abdominal:     General: There is no distension.     Palpations: Abdomen is soft.  Musculoskeletal:        General: Tenderness present.     Cervical back: Neck supple.     Right lower leg: No edema.     Left lower leg: No edema.     Comments: Patient has good distal strength with no pain over the greater trochanters.  No clonus or focal weakness.  Skin:    Findings: No erythema, lesion or rash.  Neurological:     General: No focal deficit present.     Mental Status: She is alert and oriented to person, place, and time.     Sensory: No sensory deficit.     Motor: No weakness or abnormal muscle tone.     Coordination: Coordination normal.  Psychiatric:        Mood and Affect: Mood normal.        Behavior: Behavior normal.      Imaging: XR C-ARM NO REPORT Result Date: 12/31/2023 Please see Notes tab for imaging impression.

## 2023-12-31 NOTE — Procedures (Signed)
 Lumbar Epidural Steroid Injection - Interlaminar Approach with Fluoroscopic Guidance  Patient: Maria Hampton      Date of Birth: 1945-10-15 MRN: [id number] PCP: Benjamine Aland, MD      Visit Date: 12/31/2023   Universal Protocol:     Consent Given By: the patient  Position: PRONE  Additional Comments: Vital signs were monitored before and after the procedure. Patient was prepped and draped in the usual sterile fashion. The correct patient, procedure, and site was verified.   Injection Procedure Details:   Procedure diagnoses: Lumbar radiculopathy [M54.16]   Meds Administered:  Meds ordered this encounter  Medications   methylPREDNISolone  acetate (DEPO-MEDROL ) injection 40 mg     Laterality: Left  Location/Site:  L5-S1  Needle: 3.5 in., 20 ga. Tuohy  Needle Placement: Paramedian epidural  Findings:   -Comments: Excellent flow of contrast into the epidural space.  Procedure Details: Using a paramedian approach from the side mentioned above, the region overlying the inferior lamina was localized under fluoroscopic visualization and the soft tissues overlying this structure were infiltrated with 4 ml. of 1% Lidocaine without Epinephrine. The Tuohy needle was inserted into the epidural space using a paramedian approach.   The epidural space was localized using loss of resistance along with counter oblique bi-planar fluoroscopic views.  After negative aspirate for air, blood, and CSF, a 2 ml. volume of Isovue -250 was injected into the epidural space and the flow of contrast was observed. Radiographs were obtained for documentation purposes.    The injectate was administered into the level noted above.   Additional Comments:  The patient tolerated the procedure well Dressing: 2 x 2 sterile gauze and Band-Aid    Post-procedure details: Patient was observed during the procedure. Post-procedure instructions were reviewed.  Patient left the clinic in stable  condition.

## 2023-12-31 NOTE — Progress Notes (Signed)
 Pain Scale   Average Pain 6 Patient advising she has chronic lower back pain radiating to left leg.         +Driver, -BT, -Dye Allergies.

## 2024-01-02 ENCOUNTER — Other Ambulatory Visit: Payer: Self-pay | Admitting: Physical Medicine and Rehabilitation

## 2024-01-05 DIAGNOSIS — E559 Vitamin D deficiency, unspecified: Secondary | ICD-10-CM | POA: Diagnosis not present

## 2024-01-05 DIAGNOSIS — K5080 Crohn's disease of both small and large intestine without complications: Secondary | ICD-10-CM | POA: Diagnosis not present

## 2024-01-12 ENCOUNTER — Encounter: Payer: Self-pay | Admitting: Radiology

## 2024-01-26 ENCOUNTER — Other Ambulatory Visit: Payer: Self-pay | Admitting: Physician Assistant

## 2024-01-26 DIAGNOSIS — M5416 Radiculopathy, lumbar region: Secondary | ICD-10-CM

## 2024-01-26 MED ORDER — TRAMADOL HCL 50 MG PO TABS: 50.0000 mg | ORAL_TABLET | Freq: Four times a day (QID) | ORAL | 0 refills | Status: AC | PRN

## 2024-02-06 DIAGNOSIS — E559 Vitamin D deficiency, unspecified: Secondary | ICD-10-CM | POA: Diagnosis not present

## 2024-02-06 DIAGNOSIS — K5080 Crohn's disease of both small and large intestine without complications: Secondary | ICD-10-CM | POA: Diagnosis not present
